# Patient Record
Sex: Female | Born: 1940 | Race: White | Hispanic: No | Marital: Married | Smoking: Never smoker
Health system: Southern US, Community
[De-identification: ages and names within clinical notes are randomized; demographics above are authoritative.]

## PROBLEM LIST (undated history)

## (undated) DIAGNOSIS — E785 Hyperlipidemia, unspecified: Secondary | ICD-10-CM

## (undated) DIAGNOSIS — R270 Ataxia, unspecified: Secondary | ICD-10-CM

## (undated) DIAGNOSIS — G903 Multi-system degeneration of the autonomic nervous system: Secondary | ICD-10-CM

## (undated) HISTORY — PX: SHOULDER SURGERY: SHX246

## (undated) HISTORY — PX: ABDOMINAL HYSTERECTOMY: SHX81

---

## 2009-07-18 ENCOUNTER — Encounter: Admission: RE | Admit: 2009-07-18 | Discharge: 2009-07-18 | Payer: Self-pay | Admitting: Family Medicine

## 2009-07-18 ENCOUNTER — Ambulatory Visit: Payer: Self-pay | Admitting: Family Medicine

## 2009-07-18 DIAGNOSIS — E785 Hyperlipidemia, unspecified: Secondary | ICD-10-CM | POA: Insufficient documentation

## 2009-07-18 DIAGNOSIS — S1093XA Contusion of unspecified part of neck, initial encounter: Secondary | ICD-10-CM

## 2009-07-18 DIAGNOSIS — S0083XA Contusion of other part of head, initial encounter: Secondary | ICD-10-CM

## 2009-07-18 DIAGNOSIS — S0003XA Contusion of scalp, initial encounter: Secondary | ICD-10-CM | POA: Insufficient documentation

## 2009-07-18 DIAGNOSIS — S060X0A Concussion without loss of consciousness, initial encounter: Secondary | ICD-10-CM | POA: Insufficient documentation

## 2009-07-18 DIAGNOSIS — S9030XA Contusion of unspecified foot, initial encounter: Secondary | ICD-10-CM | POA: Insufficient documentation

## 2009-07-19 ENCOUNTER — Encounter: Payer: Self-pay | Admitting: Family Medicine

## 2010-06-11 NOTE — Assessment & Plan Note (Signed)
Summary: R FOOT PAIN/WB   Vital Signs:  Patient Profile:   70 Years Old Female CC:      right foot pain-post fall Height:     66 inches Weight:      200 pounds O2 Sat:      97 % O2 treatment:    Room Air Temp:     97 degrees F oral Pulse rate:   84 / minute Pulse rhythm:   regular Resp:     16 per minute BP sitting:   127 / 77  (right arm)  Pt. in pain?   yes    Location:   right foot    Intensity:   0 at rest    Type:       sharp with movement  Vitals Entered By: Lajean Saver RN (July 18, 2009 2:04 PM)                   Prior Medication List:  No prior medications documented  Updated Prior Medication List: CRESTOR 20 MG TABS (ROSUVASTATIN CALCIUM)   Current Allergies: No known allergies History of Present Illness Chief Complaint: right foot pain-post fall History of Present Illness: [place] the beach and [place]. She is having trouble on her R foot. She hit her head and was stunned> Since then she has had headaches. She has a resting tremor. Her foot was is still hurting. She was concerned that the ortho sho was too big and for the wrong foot that they gave her.   Current Problems: CONTUSION, HEAD (ICD-920) CONCUSSION WITH NO LOSS OF CONSCIOUSNESS (ICD-850.0) CONTUSION OF FOOT (ICD-924.20) HYPERLIPIDEMIA (ICD-272.4)   Current Meds CRESTOR 20 MG TABS (ROSUVASTATIN CALCIUM)   REVIEW OF SYSTEMS Constitutional Symptoms      Denies fever, chills, night sweats, weight loss, weight gain, and fatigue.  Eyes       Denies change in vision, eye pain, eye discharge, glasses, contact lenses, and eye surgery. Ear/Nose/Throat/Mouth       Denies hearing loss/aids, change in hearing, ear pain, ear discharge, dizziness, frequent runny nose, frequent nose bleeds, sinus problems, sore throat, hoarseness, and tooth pain or bleeding.  Respiratory       Denies dry cough, productive cough, wheezing, shortness of breath, asthma, bronchitis, and emphysema/COPD.    Cardiovascular       Denies murmurs, chest pain, and tires easily with exhertion.    Gastrointestinal       Denies stomach pain, nausea/vomiting, diarrhea, constipation, blood in bowel movements, and indigestion. Genitourniary       Denies painful urination, kidney stones, and loss of urinary control. Neurological       Denies paralysis and seizures.      Comments: HA at times, questionable fainting, Musculoskeletal       Complains of muscle pain, joint pain, and joint stiffness.      Denies decreased range of motion, redness, swelling, muscle weakness, and gout.      Comments: right ankle Skin       Denies bruising, unusual mles/lumps or sores, and hair/skin or nail changes.  Psych       Denies mood changes, temper/anger issues, anxiety/stress, speech problems, depression, and sleep problems. Other Comments: Patient fell this past Friday possibly hitting her head. Her occipital region is red. She saw an MD  at the beach who gave her a surgical shoe and said she "chipped" her foot and "stretched her ligaments in her right foot. She has a history of  vertigo and exhibits mild expressive apahsia with tremors which were present befor ethe fall.   Past History:  Family History: Last updated: 07/18/2009 MI-father Dementia- mother CA- sister  Social History: Last updated: 07/18/2009 Married Never Smoked Alcohol use-no Drug use-no  Risk Factors: Smoking Status: never (07/18/2009)  Past Medical History: Hyperlipidemia Undiagnosed tremors and loss of balance  Past Surgical History: GU surgery- 10/2008  Family History: Reviewed history and no changes required. MI-father Dementia- mother CA- sister  Social History: Reviewed history and no changes required. Married Never Smoked Alcohol use-no Drug use-no Smoking Status:  never Drug Use:  no Physical Exam General appearance: well developed, well nourished, no acute distress Head: normocephalic, atraumatic Eyes:  conjunctivae and lids normal Pupils: equal, round, reactive to light Extremities: R fot tender to touch on the lateral surface  Neurological: resting tremor is present Skin: no obvious rashes or lesions MSE: oriented to time, place, and person Assessment New Problems: CONTUSION, HEAD (ICD-920) CONCUSSION WITH NO LOSS OF CONSCIOUSNESS (ICD-850.0) CONTUSION OF FOOT (ICD-924.20) HYPERLIPIDEMIA (ICD-272.4)   Plan New Orders: New Patient Level III [99203] T-CT Head w/o cm [70450] T-DG Foot Complete*R* [73630] T-DG Foot Complete*R* [73630] T-CT Head w/o cm [70450]  The patient and/or caregiver has been counseled thoroughly with regard to medications prescribed including dosage, schedule, interactions, rationale for use, and possible side effects and they verbalize understanding.  Diagnoses and expected course of recovery discussed and will return if not improved as expected or if the condition worsens. Patient and/or caregiver verbalized understanding.

## 2010-06-11 NOTE — Letter (Signed)
Summary: CT AUTHORIZATION  CT AUTHORIZATION   Imported By: Shelbie Proctor 07/19/2009 12:36:46  _____________________________________________________________________  External Attachment:    Type:   Image     Comment:   External Document

## 2012-08-26 ENCOUNTER — Emergency Department
Admission: EM | Admit: 2012-08-26 | Discharge: 2012-08-26 | Disposition: A | Discharge status: Home / Self Care | Payer: Medicare Other | Source: Home / Self Care

## 2012-08-26 ENCOUNTER — Encounter: Payer: Self-pay | Admitting: *Deleted

## 2012-08-26 DIAGNOSIS — L251 Unspecified contact dermatitis due to drugs in contact with skin: Secondary | ICD-10-CM

## 2012-08-26 DIAGNOSIS — L233 Allergic contact dermatitis due to drugs in contact with skin: Secondary | ICD-10-CM

## 2012-08-26 HISTORY — DX: Multi-system degeneration of the autonomic nervous system: G90.3

## 2012-08-26 HISTORY — DX: Ataxia, unspecified: R27.0

## 2012-08-26 HISTORY — DX: Hyperlipidemia, unspecified: E78.5

## 2012-08-26 MED ORDER — TRIAMCINOLONE ACETONIDE 0.1 % EX CREA: TOPICAL_CREAM | Freq: Two times a day (BID) | CUTANEOUS | Status: DC

## 2012-08-26 NOTE — ED Notes (Signed)
Alexx c/o rash to chest and neck x 2 days. She used a new facial wash that she believes she rubbed her neck with and didn't rinse well. Rash itches and burns.

## 2012-08-26 NOTE — ED Provider Notes (Signed)
History     CSN: [id number]  Arrival date & time 08/26/12  1433   None     Chief Complaint  Patient presents with  . Rash       HPI Comments: Patient applied a cream containing 2% salicylic acid to her face two days ago, and some of the cream also contacted her neck and upper chest.  Yesterday she noted a pruritic rash on the exposed areas.  She feels well otherwise.  Patient is a 72 y.o. female presenting with rash. The history is provided by the patient.  Rash Location: upper chest and neck. Quality: burning, dryness, itchiness and redness   Quality: not blistering, not bruising, not draining, not painful, not peeling, not scaling, not swelling and not weeping   Severity:  Mild Onset quality:  Sudden Duration:  1 day Timing:  Constant Progression:  Unchanged Chronicity:  New Context comment:  Medicated cream Relieved by:  Nothing Worsened by:  Nothing tried Associated symptoms: no fatigue, no fever, no headaches, no induration, no joint pain, no myalgias, no nausea, no shortness of breath, no sore throat, no throat swelling, no tongue swelling and no URI     Past Medical History  Diagnosis Date  . Neurologic orthostatic hypotension   . Ataxia   . Hyperlipidemia     Past Surgical History  Procedure Laterality Date  . Shoulder surgery Right     Family History  Problem Relation Age of Onset  . Alzheimer's disease Mother   . Heart attack Father   . Cancer Sister     History  Substance Use Topics  . Smoking status: Never Smoker   . Smokeless tobacco: Never Used  . Alcohol Use: No    OB History   Grav Para Term Preterm Abortions TAB SAB Ect Mult Living                  Review of Systems  Constitutional: Negative for fever and fatigue.  HENT: Negative for sore throat.   Respiratory: Negative for shortness of breath.   Gastrointestinal: Negative for nausea.  Musculoskeletal: Negative for myalgias and arthralgias.  Skin: Positive for rash.    Neurological: Negative for headaches.  All other systems reviewed and are negative.    Allergies  Review of patient's allergies indicates no known allergies.  Home Medications   Current Outpatient Rx  Name  Route  Sig  Dispense  Refill  . fish oil-omega-3 fatty acids 1000 MG capsule   Oral   Take 2 g by mouth daily.         Marland Kitchen triamcinolone cream (KENALOG) 0.1 %   Topical   Apply topically 2 (two) times daily.   30 g   1     BP 139/82  Pulse 67  Temp(Src) 97.7 F (36.5 C) (Oral)  Resp 14  Ht 5\' 6"  (1.676 m)  Wt 204 lb (92.534 kg)  BMI 32.94 kg/m2  SpO2 96%  Physical Exam  Constitutional: She is oriented to person, place, and time. She appears well-developed and well-nourished. No distress.  Patient is obese (BMI 32.9)  HENT:  Head: Atraumatic.  Eyes: Conjunctivae are normal. Pupils are equal, round, and reactive to light.  Neck: Neck supple.  Lymphadenopathy:    She has no cervical adenopathy.  Neurological: She is alert and oriented to person, place, and time.  Skin: Skin is warm and dry. Rash noted. No lesion and no purpura noted. Rash is macular. Rash is not papular, not nodular,  not pustular, not vesicular and not urticarial. There is erythema.     There is mild macular erythema on upper chest and neck as noted on diagram.  No swelling or tenderness.  No vesicles.    ED Course  Procedures        1. Allergic contact dermatitis due to drugs in contact with skin       MDM  Begin triamcinolone 0.1% cream BID. Avoid applying other creams/lotions until rash resolved.  Avoid sun exposure. Minimize baths/showers until rash improved.  Use a mild bath soap containing oil such as unscented Dove. Followup with dermatologist if not improving one week.        Lattie Haw, MD 08/26/12 [phone]

## 2013-03-20 ENCOUNTER — Emergency Department (INDEPENDENT_AMBULATORY_CARE_PROVIDER_SITE_OTHER)
Admission: EM | Admit: 2013-03-20 | Discharge: 2013-03-20 | Disposition: A | Discharge status: Home / Self Care | Payer: Medicare Other | Source: Home / Self Care | Attending: Family Medicine | Admitting: Family Medicine

## 2013-03-20 ENCOUNTER — Encounter: Payer: Self-pay | Admitting: Emergency Medicine

## 2013-03-20 ENCOUNTER — Emergency Department (INDEPENDENT_AMBULATORY_CARE_PROVIDER_SITE_OTHER): Payer: Medicare Other

## 2013-03-20 DIAGNOSIS — S43402A Unspecified sprain of left shoulder joint, initial encounter: Secondary | ICD-10-CM

## 2013-03-20 DIAGNOSIS — IMO0002 Reserved for concepts with insufficient information to code with codable children: Secondary | ICD-10-CM

## 2013-03-20 DIAGNOSIS — M25519 Pain in unspecified shoulder: Secondary | ICD-10-CM

## 2013-03-20 NOTE — ED Notes (Signed)
Patient reports falling in home last night and today left shoulder, arm and upper back are sore upon ROM.

## 2013-03-20 NOTE — ED Provider Notes (Signed)
CSN: [id number]     Arrival date & time 03/20/13  1348 History   First MD Initiated Contact with Patient 03/20/13 1510     Chief Complaint  Patient presents with  . Arm Pain  . Shoulder Pain  . Back Pain      HPI Comments: Patient has a history of orthostatic hypotension for which she takes Florinef.  While arising from a chair last night she became light-headed and fell, injuring her left shoulder.  She denies loss of consciousness however.  Today she has generalized soreness, but her left shoulder is most painful and she has pain when raising her left arm.  Patient is a 72 y.o. female presenting with shoulder injury. The history is provided by the patient.  Shoulder Injury This is a new problem. The current episode started yesterday. The problem occurs constantly. The problem has not changed since onset.Pertinent negatives include no chest pain, no abdominal pain, no headaches and no shortness of breath. Exacerbated by: raising left arm. Nothing relieves the symptoms. She has tried nothing for the symptoms.    Past Medical History  Diagnosis Date  . Neurologic orthostatic hypotension   . Ataxia   . Hyperlipidemia    Past Surgical History  Procedure Laterality Date  . Shoulder surgery Right    Family History  Problem Relation Age of Onset  . Alzheimer's disease Mother   . Heart attack Father   . Cancer Sister    History  Substance Use Topics  . Smoking status: Never Smoker   . Smokeless tobacco: Never Used  . Alcohol Use: No   OB History   Grav Para Term Preterm Abortions TAB SAB Ect Mult Living                 Review of Systems  Respiratory: Negative for shortness of breath.   Cardiovascular: Negative for chest pain.  Gastrointestinal: Negative for abdominal pain.  Neurological: Negative for headaches.  All other systems reviewed and are negative.    Allergies  Review of patient's allergies indicates no known allergies.  Home Medications   Current Outpatient  Rx  Name  Route  Sig  Dispense  Refill  . alendronate (FOSAMAX) 70 MG tablet   Oral   Take 70 mg by mouth every 7 (seven) days. Take with a full glass of water on an empty stomach.         . escitalopram (LEXAPRO) 20 MG tablet   Oral   Take 20 mg by mouth daily.         . fish oil-omega-3 fatty acids 1000 MG capsule   Oral   Take 2 g by mouth daily.         . fludrocortisone (FLORINEF) 0.1 MG tablet   Oral   Take 0.1 mg by mouth daily.         Marland Kitchen omeprazole (PRILOSEC) 20 MG capsule   Oral   Take 20 mg by mouth daily.         Marland Kitchen oxybutynin (DITROPAN-XL) 10 MG 24 hr tablet   Oral   Take 10 mg by mouth daily.         . simvastatin (ZOCOR) 40 MG tablet   Oral   Take 40 mg by mouth every evening.         . triamcinolone cream (KENALOG) 0.1 %   Topical   Apply topically 2 (two) times daily.   30 g   1    BP 142/79  Pulse  73  Temp(Src) 98 F (36.7 C) (Oral)  Resp 16  Ht 5\' 6"  (1.676 m)  Wt 198 lb (89.812 kg)  BMI 31.97 kg/m2  SpO2 96% Physical Exam  Nursing note and vitals reviewed. Constitutional: She is oriented to person, place, and time. She appears well-developed and well-nourished.  Patient is obese (BMI 32.0)  HENT:  Head: Atraumatic.  Mouth/Throat: Oropharynx is clear and moist.  Eyes: Conjunctivae are normal. Pupils are equal, round, and reactive to light.  Neck: Normal range of motion.  Cardiovascular: Normal heart sounds.   Pulmonary/Chest: Breath sounds normal.  Abdominal: There is no tenderness.  Musculoskeletal:       Left shoulder: She exhibits decreased range of motion, tenderness, bony tenderness, pain and decreased strength. She exhibits no swelling, no crepitus, no deformity, no laceration and normal pulse.       Arms: Patient is unable to actively abduct her left shoulder above horizontal, and passively cannot raise above 20 degrees above horizontal.  She has diffuse tenderness over the proximal humerus.  Apley's test positive.   Empty can test positive.  Internal and external rotation strength maintained.  Distal neurovascular function is intact.   Neurological: She is alert and oriented to person, place, and time.  Skin: Skin is warm and dry.    ED Course  Procedures      Imaging Review Dg Shoulder Left  03/20/2013   CLINICAL DATA:  Left shoulder pain post fall  EXAM: LEFT SHOULDER - 2+ VIEW  COMPARISON:  None.  FINDINGS: Three views of left shoulder submitted. No acute fracture or subluxation. Mild degenerative changes left AC joint.  IMPRESSION: No acute fracture or subluxation. Mild degenerative changes left acromioclavicular joint.   Electronically Signed   By: Natasha Mead M.D.   On: 03/20/2013 15:54      MDM   1. Shoulder sprain, left, initial encounter; suspect rotator cuff injury   Patient prefers not to wear sling. Apply ice pack 3 to 4 times daily.  May take Tylenol for pain.  Begin pendulum exercises. Followup with Dr. Rodney Langton in two days. Recommend follow-up with PCP for re-evaluation of her orthostatic hypotension (may need increased dose of Florinef)    Lattie Haw, MD 03/20/13 [phone]

## 2013-03-22 ENCOUNTER — Encounter: Payer: Self-pay | Admitting: Sports Medicine

## 2013-03-22 ENCOUNTER — Ambulatory Visit (INDEPENDENT_AMBULATORY_CARE_PROVIDER_SITE_OTHER): Payer: Medicare Other | Admitting: Sports Medicine

## 2013-03-22 VITALS — BP 119/70 | HR 73 | Wt 201.0 lb

## 2013-03-22 DIAGNOSIS — M25519 Pain in unspecified shoulder: Secondary | ICD-10-CM

## 2013-03-22 DIAGNOSIS — M25512 Pain in left shoulder: Secondary | ICD-10-CM | POA: Insufficient documentation

## 2013-03-22 DIAGNOSIS — I951 Orthostatic hypotension: Secondary | ICD-10-CM

## 2013-03-22 NOTE — Assessment & Plan Note (Signed)
I think that this pleasant 72 year old female has merely strained her rotator cuff. She will do her rotator cuff exercises, continue Aleve, and she can come back to see me if no better in about 3 weeks. We will leave her followup open-ended at this point.

## 2013-03-22 NOTE — Assessment & Plan Note (Signed)
It sounds as though she is on fludrocortisone, she does get approximately 30 mm mercury drop in blood pressure with standing. Unfortunately her current cardiologist has retired and she is looking for a new one. I've asked her to liberalize the salt in her diet, and I am happy to provide a referral to Dr. Jens Som downstairs. I am not her primary care provider, she does see Dr. Gae Dry at [facility].

## 2013-03-22 NOTE — Progress Notes (Signed)
   Subjective:    I'm seeing this patient as a consultation for:  Dr. Cathren Harsh  CC: Left shoulder pain  HPI: This is a pleasant 72 year old female, she is a history of orthostatic hypotension, and fell recently on her left shoulder. She had immediate pain, and a small amount of bruising on her left anterior chest without tenderness. Now she hurts over her deltoid, and is worse with overhead activities. Overall symptoms have improved greatly since her fall. Symptoms are mild, improving. No neck pain, no numbness or tingling radiating down the arm.  Orthostatic hypotension: She does tell me she gets a 30 mmHg drop in her blood pressure on standing, this has led to many falls. She did have a cardiologist who has since retired and she is looking for another one. He has placed her on fludrocortisone per patient report.  Past medical history, Surgical history, Family history not pertinant except as noted below, Social history, Allergies, and medications have been entered into the medical record, reviewed, and no changes needed.   Review of Systems: No headache, visual changes, nausea, vomiting, diarrhea, constipation, dizziness, abdominal pain, skin rash, fevers, chills, night sweats, weight loss, swollen lymph nodes, body aches, joint swelling, muscle aches, chest pain, shortness of breath, mood changes, visual or auditory hallucinations.   Objective:   General: Well Developed, well nourished, and in no acute distress.  Neuro/Psych: Alert and oriented x3, extra-ocular muscles intact, able to move all 4 extremities, sensation grossly intact. Skin: Warm and dry, no rashes noted.  Respiratory: Not using accessory muscles, speaking in full sentences, trachea midline.  Cardiovascular: Pulses palpable, no extremity edema. Abdomen: Does not appear distended. Left Shoulder: Inspection reveals no abnormalities, atrophy or asymmetry. Palpation is normal with no tenderness over AC joint or bicipital  groove. ROM is full in all planes. Rotator cuff strength normal throughout. Positive Neer and Hawkin's tests, empty can sign. Speeds and Yergason's tests normal. No labral pathology noted with negative Obrien's, negative clunk and good stability. Normal scapular function observed. No painful arc and no drop arm sign. No apprehension sign  X-rays were personally reviewed, there is no sign of fracture, dislocation, there is mild to moderate degenerative changes in the acromioclavicular joint. Impression and Recommendations:   This case required medical decision making of moderate complexity.

## 2013-03-29 ENCOUNTER — Encounter: Payer: Self-pay | Admitting: Cardiology

## 2013-03-30 ENCOUNTER — Ambulatory Visit (INDEPENDENT_AMBULATORY_CARE_PROVIDER_SITE_OTHER): Payer: Medicare Other | Admitting: Cardiology

## 2013-03-30 ENCOUNTER — Encounter: Payer: Self-pay | Admitting: Cardiology

## 2013-03-30 VITALS — BP 154/83 | HR 64 | Ht 66.0 in | Wt 201.0 lb

## 2013-03-30 DIAGNOSIS — E785 Hyperlipidemia, unspecified: Secondary | ICD-10-CM

## 2013-03-30 DIAGNOSIS — I951 Orthostatic hypotension: Secondary | ICD-10-CM

## 2013-03-30 MED ORDER — MIDODRINE HCL 5 MG PO TABS: 5.0000 mg | ORAL_TABLET | Freq: Three times a day (TID) | ORAL | Status: DC

## 2013-03-30 NOTE — Patient Instructions (Addendum)
Your physician recommends that you schedule a follow-up appointment in: 4 to 6 WEEKS WITH DR CRENSHAW  STOP FLORINEF  START MIDODRINE 5 MG ONE TABLET THREE TIMES DAILY   USE COMPRESSION HOSE

## 2013-03-30 NOTE — Progress Notes (Signed)
HPI: 72 year old female for evaluation of orthostatic hypotension. Patient has had orthostatic hypotension for 78 years. She was seen by a cardiologist in [place] in 2011 after having a syncopal episode and injuring her shoulder. I do not have those records available. She has been maintained on Florinef. Recently her symptoms have began to increase in frequency. She has not had dyspnea on exertion, orthopnea, PND, pedal edema, chest pain or palpitations. She has occasions where after standing she will have frank syncope. These never occur in a sitting or lying position. She has had 4 episodes in the past one year. 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 She has been seen by neurology in the past.  Current Outpatient Prescriptions  Medication Sig Dispense Refill  . alendronate (FOSAMAX) 70 MG tablet Take 70 mg by mouth every 7 (seven) days. Take with a full glass of water on an empty stomach.      . escitalopram (LEXAPRO) 20 MG tablet Take 20 mg by mouth daily.      . fish oil-omega-3 fatty acids 1000 MG capsule Take 2 g by mouth daily.      Marland Kitchen omeprazole (PRILOSEC) 20 MG capsule Take 20 mg by mouth daily.      Marland Kitchen oxybutynin (DITROPAN-XL) 10 MG 24 hr tablet Take 10 mg by mouth daily.      . simvastatin (ZOCOR) 40 MG tablet Take 40 mg by mouth every evening.      . triamcinolone cream (KENALOG) 0.1 % Apply topically 2 (two) times daily.  30 g  1  . midodrine (PROAMATINE) 5 MG tablet Take 1 tablet (5 mg total) by mouth 3 (three) times daily with meals.  270 tablet  3   No current facility-administered medications for this visit.    No Known Allergies  Past Medical History  Diagnosis Date  . Neurologic orthostatic hypotension   . Ataxia   . Hyperlipidemia     Past Surgical History  Procedure Laterality Date  . Shoulder surgery Right   . Abdominal hysterectomy      History   Social History  . Marital Status: Married    Spouse Name: N/A   Number of Children: 2  . Years of Education: N/A   Occupational History  . Not on file.   Social History Main Topics  . Smoking status: Never Smoker   . Smokeless tobacco: Never Used  . Alcohol Use: No  . Drug Use: No  . Sexual Activity: Not on file   Other Topics Concern  . Not on file   Social History Narrative  . No narrative on file    Family History  Problem Relation Age of Onset  . Alzheimer's disease Mother   . Heart attack Father     MI at age 5  . Cancer Sister     ROS: ataxia and poor balance but no fevers or chills, productive cough, hemoptysis, dysphasia, odynophagia, melena, hematochezia, dysuria, hematuria, rash, seizure activity, orthopnea, PND, pedal edema, claudication. Remaining systems are negative.  Physical Exam:   Blood pressure 154/83, pulse 64, height 5\' 6"  (1.676 m), weight 201 lb (91.173 kg), SpO2 94.00%.  General:  Well developed/well nourished in NAD Skin warm/dry Patient not depressed No peripheral clubbing Back-normal HEENT-normal/normal eyelids Neck supple/normal carotid upstroke bilaterally; no bruits; no JVD; no thyromegaly chest - CTA/ normal expansion CV - RRR/normal S1 and S2; no murmurs, rubs or gallops;  PMI nondisplaced Abdomen -NT/ND, no HSM, no mass, +  bowel sounds, no bruit 2+ femoral pulses, no bruits Ext-no edema, chords, 2+ DP Neuro-intention tremor  ECG sinus rhythm with no ST changes.

## 2013-03-30 NOTE — Assessment & Plan Note (Signed)
Management per primary care. 

## 2013-03-30 NOTE — Assessment & Plan Note (Signed)
Patient is documented to have orthostasis today in the office. Sitting her blood pressure was 150/84 with a pulse of 64. In the standing position she was 122/84 with a pulse of 74. She is having increasing frequency of syncopal episodes after standing. I discussed at length today measures to decrease the frequency of her meds. We discussed the importance of by mouth fluid intake and sodium intake. Have recommended compression hose. I discussed standing slowly. It appears that Florinef is not controlling her symptoms. I will discontinue this medicine and add midodrine 5 mg by mouth 3 times a day. I will see her back in 6-8 weeks to see if her symptoms are better. We'll obtain records from her previous cardiologist including LV function on previous echo.

## 2013-04-13 ENCOUNTER — Encounter: Payer: Self-pay | Admitting: Cardiology

## 2013-04-13 ENCOUNTER — Ambulatory Visit (INDEPENDENT_AMBULATORY_CARE_PROVIDER_SITE_OTHER): Payer: Medicare Other | Admitting: Cardiology

## 2013-04-13 VITALS — BP 140/80 | HR 68 | Wt 203.0 lb

## 2013-04-13 DIAGNOSIS — I951 Orthostatic hypotension: Secondary | ICD-10-CM

## 2013-04-13 DIAGNOSIS — E785 Hyperlipidemia, unspecified: Secondary | ICD-10-CM

## 2013-04-13 NOTE — Patient Instructions (Signed)
Your physician recommends that you schedule a follow-up appointment in: 3 MONTHS WITH DR CRENSHAW  

## 2013-04-13 NOTE — Progress Notes (Signed)
      HPI: FU orthostatic hypotension. Patient has had orthostatic hypotension for 7-8 years. Previously followed by a cardiologist in [place]. When I saw her in November we discontinued her Florinef and began midodrine because of worsening symptoms. Since then, she denies dyspnea, chest pain. She has had no recurrent syncopal episodes. She denies dizziness with standing.  Current Outpatient Prescriptions  Medication Sig Dispense Refill  . alendronate (FOSAMAX) 70 MG tablet Take 70 mg by mouth every 7 (seven) days. Take with a full glass of water on an empty stomach.      . escitalopram (LEXAPRO) 20 MG tablet Take 20 mg by mouth daily.      . fish oil-omega-3 fatty acids 1000 MG capsule Take 2 g by mouth daily.      . midodrine (PROAMATINE) 5 MG tablet Take 1 tablet (5 mg total) by mouth 3 (three) times daily with meals.  270 tablet  3  . omeprazole (PRILOSEC) 20 MG capsule Take 20 mg by mouth daily.      Marland Kitchen oxybutynin (DITROPAN-XL) 10 MG 24 hr tablet Take 10 mg by mouth daily.      . simvastatin (ZOCOR) 40 MG tablet Take 40 mg by mouth every evening.       No current facility-administered medications for this visit.     Past Medical History  Diagnosis Date  . Neurologic orthostatic hypotension   . Ataxia   . Hyperlipidemia     Past Surgical History  Procedure Laterality Date  . Shoulder surgery Right   . Abdominal hysterectomy      History   Social History  . Marital Status: Married    Spouse Name: N/A    Number of Children: 2  . Years of Education: N/A   Occupational History  . Not on file.   Social History Main Topics  . Smoking status: Never Smoker   . Smokeless tobacco: Never Used  . Alcohol Use: No  . Drug Use: No  . Sexual Activity: Not on file   Other Topics Concern  . Not on file   Social History Narrative  . No narrative on file    ROS: no fevers or chills, productive cough, hemoptysis, dysphasia, odynophagia, melena, hematochezia, dysuria,  hematuria, rash, seizure activity, orthopnea, PND, pedal edema, claudication. Remaining systems are negative.  Physical Exam: Well-developed well-nourished in no acute distress.  Skin is warm and dry.  HEENT is normal.  Neck is supple.  Chest is clear to auscultation with normal expansion.  Cardiovascular exam is regular rate and rhythm.  Abdominal exam nontender or distended. No masses palpated. Extremities show no edema. neuro resting tremors and unsteady gait

## 2013-04-13 NOTE — Assessment & Plan Note (Signed)
Patient feels that present measures have improved her overall symptoms. There is no dizziness with standing and she has had no syncope. Continue midodrine at present dose. Continue compression hose. Continue high sodium diet and increase by mouth fluid intake. We can increase the dose of her midodrine in the future if her symptoms worsen.

## 2013-04-13 NOTE — Assessment & Plan Note (Signed)
Management per primary care. 

## 2013-04-19 ENCOUNTER — Telehealth: Payer: Self-pay | Admitting: Cardiology

## 2013-04-19 NOTE — Telephone Encounter (Signed)
ROI faxed to [facility] @ [phone]

## 2013-04-27 ENCOUNTER — Ambulatory Visit: Payer: Medicare Other | Admitting: Cardiology

## 2013-05-11 ENCOUNTER — Telehealth: Payer: Self-pay | Admitting: Cardiology

## 2013-05-11 NOTE — Telephone Encounter (Signed)
Records rec From [facility] will Hold For Debra M

## 2013-05-17 ENCOUNTER — Encounter: Payer: Self-pay | Admitting: Sports Medicine

## 2013-05-17 ENCOUNTER — Ambulatory Visit (INDEPENDENT_AMBULATORY_CARE_PROVIDER_SITE_OTHER): Payer: Medicare Other | Admitting: Sports Medicine

## 2013-05-17 VITALS — BP 125/69 | HR 70 | Wt 207.0 lb

## 2013-05-17 DIAGNOSIS — M25519 Pain in unspecified shoulder: Secondary | ICD-10-CM

## 2013-05-17 DIAGNOSIS — M25512 Pain in left shoulder: Secondary | ICD-10-CM

## 2013-05-17 NOTE — Assessment & Plan Note (Signed)
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 Home rehabilitation exercises, return to see me in one month.

## 2013-05-17 NOTE — Progress Notes (Signed)
  Subjective:    CC:  Followup  HPI: Left shoulder pain: I saw this pleasant 73 year old female about 2 months ago after a fall onto her left shoulder, unfortunately she's continued to have pain she localizes directly over the a.c. joint as well as over the deltoid, worse with crossing her arm across the chest and overhead activities, no mechanical symptoms. No pain in the neck, no radiation down to the hand her fingertips. Moderate, persistent.  Past medical history, Surgical history, Family history not pertinant except as noted below, Social history, Allergies, and medications have been entered into the medical record, reviewed, and no changes needed.   Review of Systems: No fevers, chills, night sweats, weight loss, chest pain, or shortness of breath.   Objective:    General: Well Developed, well nourished, and in no acute distress.  Neuro: Alert and oriented x3, extra-ocular muscles intact, sensation grossly intact.  HEENT: Normocephalic, atraumatic, pupils equal round reactive to light, neck supple, no masses, no lymphadenopathy, thyroid nonpalpable.  Skin: Warm and dry, no rashes. Cardiac: Regular rate and rhythm, no murmurs rubs or gallops, no lower extremity edema.  Respiratory: Clear to auscultation bilaterally. Not using accessory muscles, speaking in full sentences. Left shoulder: Positive Neer's, Hawkins, and in she can sign, positive cross arm sign, negative crank test, negative O'Brien's test. Tender to palpation directly over the acromioclavicular joint.  Procedure: Real-time Ultrasound Guided Injection of left acromioclavicular joint Device: GE Logiq E  Verbal informed consent obtained.  Time-out conducted.  Noted no overlying erythema, induration, or other signs of local infection.  Skin prepped in a sterile fashion.  Local anesthesia: Topical Ethyl chloride.  With sterile technique and under real time ultrasound guidance:  25-gauge needle advanced into the joint, 0.5 cc  Kenalog 40, 1 cc lidocaine injected easily. Completed without difficulty  Pain immediately resolved suggesting accurate placement of the medication.  Advised to call if fevers/chills, erythema, induration, drainage, or persistent bleeding.  Images permanently stored and available for review in the ultrasound unit.  Impression: Technically successful ultrasound guided injection.  Procedure: Real-time Ultrasound Guided Injection of left subacromial bursa Device: GE Logiq E  Verbal informed consent obtained.  Time-out conducted.  Noted no overlying erythema, induration, or other signs of local infection.  Skin prepped in a sterile fashion.  Local anesthesia: Topical Ethyl chloride.  With sterile technique and under real time ultrasound guidance:  I did note a small bursal sided tear at the distal insertion of the supraspinatus, the results of a small partial thickness articular sided tear of the supraspinatus as well. I placed 1 cc Kenalog 40, 3 cc lidocaine directly into the subacromial bursa. Completed without difficulty  Pain immediately resolved suggesting accurate placement of the medication.  Advised to call if fevers/chills, erythema, induration, drainage, or persistent bleeding.  Images permanently stored and available for review in the ultrasound unit.  Impression: Technically successful ultrasound guided injection.  Impression and Recommendations:

## 2013-06-21 ENCOUNTER — Encounter: Payer: Self-pay | Admitting: Sports Medicine

## 2013-06-21 ENCOUNTER — Ambulatory Visit (INDEPENDENT_AMBULATORY_CARE_PROVIDER_SITE_OTHER): Payer: Medicare Other | Admitting: Sports Medicine

## 2013-06-21 VITALS — BP 122/69 | HR 87 | Ht 66.0 in | Wt 208.0 lb

## 2013-06-21 DIAGNOSIS — M25512 Pain in left shoulder: Secondary | ICD-10-CM

## 2013-06-21 DIAGNOSIS — M25519 Pain in unspecified shoulder: Secondary | ICD-10-CM

## 2013-06-21 NOTE — Assessment & Plan Note (Signed)
Unfortunately failed conservative measures in the form of physical therapy, NSAIDs, last visit injections were placed into the acromioclavicular joint and subacromial bursa, she returns pain-free. Return as needed.

## 2013-06-21 NOTE — Progress Notes (Signed)
  Subjective:    CC: Followup  HPI: Left shoulder pain: At the last visit I performed ultrasound guided acromioclavicular and subacromial bursa injections. She returns today completely pain-free.  Past medical history, Surgical history, Family history not pertinant except as noted below, Social history, Allergies, and medications have been entered into the medical record, reviewed, and no changes needed.   Review of Systems: No fevers, chills, night sweats, weight loss, chest pain, or shortness of breath.   Objective:    General: Well Developed, well nourished, and in no acute distress.  Neuro: Alert and oriented x3, extra-ocular muscles intact, sensation grossly intact.  HEENT: Normocephalic, atraumatic, pupils equal round reactive to light, neck supple, no masses, no lymphadenopathy, thyroid nonpalpable.  Skin: Warm and dry, no rashes. Cardiac: Regular rate and rhythm, no murmurs rubs or gallops, no lower extremity edema.  Respiratory: Clear to auscultation bilaterally. Not using accessory muscles, speaking in full sentences. Left Shoulder: Inspection reveals no abnormalities, atrophy or asymmetry. Palpation is normal with no tenderness over AC joint or bicipital groove. ROM is full in all planes. Rotator cuff strength normal throughout. No signs of impingement with negative Neer and Hawkin's tests, empty can sign. Speeds and Yergason's tests normal. No labral pathology noted with negative Obrien's, negative clunk and good stability. Normal scapular function observed. No painful arc and no drop arm sign. No apprehension sign  Impression and Recommendations:

## 2013-07-20 ENCOUNTER — Encounter: Payer: Self-pay | Admitting: Cardiology

## 2013-07-20 ENCOUNTER — Other Ambulatory Visit: Payer: Self-pay | Admitting: *Deleted

## 2013-07-20 ENCOUNTER — Ambulatory Visit (INDEPENDENT_AMBULATORY_CARE_PROVIDER_SITE_OTHER): Payer: Medicare Other | Admitting: Cardiology

## 2013-07-20 VITALS — BP 130/68 | HR 67 | Ht 66.0 in | Wt 208.0 lb

## 2013-07-20 DIAGNOSIS — I951 Orthostatic hypotension: Secondary | ICD-10-CM

## 2013-07-20 DIAGNOSIS — E785 Hyperlipidemia, unspecified: Secondary | ICD-10-CM

## 2013-07-20 MED ORDER — MIDODRINE HCL 5 MG PO TABS: 5.0000 mg | ORAL_TABLET | Freq: Three times a day (TID) | ORAL | Status: DC

## 2013-07-20 NOTE — Patient Instructions (Signed)
Your physician wants you to follow-up in: 6 MONTHS WITH DR CRENSHAW You will receive a reminder letter in the mail two months in advance. If you don't receive a letter, please call our office to schedule the follow-up appointment.  

## 2013-07-20 NOTE — Assessment & Plan Note (Signed)
Patient feels that present measures have improved her overall symptoms. There is no dizziness with standing and she has had no syncope although she has fallen. Continue midodrine at present dose. Continue compression hose. Continue high sodium diet and increase by mouth fluid intake. We can increase the dose of her midodrine in the future if her symptoms worsen.

## 2013-07-20 NOTE — Progress Notes (Signed)
      HPI: FU orthostatic hypotension. Patient has had orthostatic hypotension for 7-8 years. [place]. When I saw her in November 2014 we discontinued her Florinef and began midodrine because of worsening symptoms. I last saw her in Dec 2014. Since then, she denies dyspnea, chest pain. She has fallen but had no recurrent syncopal episodes. She denies dizziness with standing.   Current Outpatient Prescriptions  Medication Sig Dispense Refill  . alendronate (FOSAMAX) 70 MG tablet Take 70 mg by mouth every 7 (seven) days. Take with a full glass of water on an empty stomach.      . escitalopram (LEXAPRO) 20 MG tablet Take 20 mg by mouth daily.      . fish oil-omega-3 fatty acids 1000 MG capsule Take 2 g by mouth daily.      . midodrine (PROAMATINE) 5 MG tablet Take 1 tablet (5 mg total) by mouth 3 (three) times daily with meals.  270 tablet  3  . omeprazole (PRILOSEC) 20 MG capsule Take 20 mg by mouth daily.      Marland Kitchen. oxybutynin (DITROPAN-XL) 10 MG 24 hr tablet Take 10 mg by mouth daily.      . simvastatin (ZOCOR) 40 MG tablet Take 40 mg by mouth every evening.       No current facility-administered medications for this visit.     Past Medical History  Diagnosis Date  . Neurologic orthostatic hypotension   . Ataxia   . Hyperlipidemia     Past Surgical History  Procedure Laterality Date  . Shoulder surgery Right   . Abdominal hysterectomy      History   Social History  . Marital Status: Married    Spouse Name: N/A    Number of Children: 2  . Years of Education: N/A   Occupational History  . Not on file.   Social History Main Topics  . Smoking status: Never Smoker   . Smokeless tobacco: Never Used  . Alcohol Use: No  . Drug Use: No  . Sexual Activity: Not on file   Other Topics Concern  . Not on file   Social History Narrative  . No narrative on file    ROS: no fevers or chills, productive cough, hemoptysis, dysphasia,  odynophagia, melena, hematochezia, dysuria, hematuria, rash, seizure activity, orthopnea, PND, pedal edema, claudication. Remaining systems are negative.  Physical Exam: Well-developed well-nourished in no acute distress.  Skin is warm and dry.  HEENT is normal.  Neck is supple.  Chest is clear to auscultation with normal expansion.  Cardiovascular exam is regular rate and rhythm.  Abdominal exam nontender or distended. No masses palpated. Extremities show no edema. neuro ataxia and tremor  ECG sinus rhythm at a rate of 67. No ST changes.

## 2013-07-20 NOTE — Assessment & Plan Note (Signed)
Continue statin. 

## 2014-03-29 ENCOUNTER — Encounter: Payer: Self-pay | Admitting: Cardiology

## 2014-03-29 ENCOUNTER — Ambulatory Visit (INDEPENDENT_AMBULATORY_CARE_PROVIDER_SITE_OTHER): Payer: Medicare Other | Admitting: Cardiology

## 2014-03-29 VITALS — BP 130/78 | HR 69 | Ht 66.0 in | Wt 207.1 lb

## 2014-03-29 DIAGNOSIS — I951 Orthostatic hypotension: Secondary | ICD-10-CM

## 2014-03-29 NOTE — Progress Notes (Signed)
      HPI: FU orthostatic hypotension. Patient has had orthostatic hypotension for years. Previously followed by a cardiologist in [place]. When I saw her in November 2014 we discontinued her Florinef and began midodrine because of worsening symptoms; she improved; since last seen, She has no dyspnea, chest pain or palpitations. She has some dizziness with standing but much improved compared to the past.  Current Outpatient Prescriptions  Medication Sig Dispense Refill  . alendronate (FOSAMAX) 70 MG tablet Take 70 mg by mouth every 7 (seven) days. Take with a full glass of water on an empty stomach.    . escitalopram (LEXAPRO) 20 MG tablet Take 20 mg by mouth daily.    . fish oil-omega-3 fatty acids 1000 MG capsule Take 2 g by mouth daily.    . midodrine (PROAMATINE) 5 MG tablet Take 1 tablet (5 mg total) by mouth 3 (three) times daily with meals. 270 tablet 3  . omeprazole (PRILOSEC) 20 MG capsule Take 20 mg by mouth daily.    . simvastatin (ZOCOR) 40 MG tablet Take 40 mg by mouth every evening.    Marland Kitchen. oxybutynin (DITROPAN-XL) 10 MG 24 hr tablet Take 10 mg by mouth daily.     No current facility-administered medications for this visit.     Past Medical History  Diagnosis Date  . Neurologic orthostatic hypotension   . Ataxia   . Hyperlipidemia     Past Surgical History  Procedure Laterality Date  . Shoulder surgery Right   . Abdominal hysterectomy      History   Social History  . Marital Status: Married    Spouse Name: N/A    Number of Children: 2  . Years of Education: N/A   Occupational History  . Not on file.   Social History Main Topics  . Smoking status: Never Smoker   . Smokeless tobacco: Never Used  . Alcohol Use: No  . Drug Use: No  . Sexual Activity: Not on file   Other Topics Concern  . Not on file   Social History Narrative    ROS: no fevers or chills, productive cough, hemoptysis, dysphasia, odynophagia, melena, hematochezia, dysuria, hematuria,  rash, seizure activity, orthopnea, PND, pedal edema, claudication. Remaining systems are negative.  Physical Exam: Well-developed well-nourished in no acute distress.  Skin is warm and dry.  HEENT is normal.  Neck is supple.  Chest is clear to auscultation with normal expansion.  Cardiovascular exam is regular rate and rhythm.  Abdominal exam nontender or distended. No masses palpated. Extremities show no edema. neuro ataxia  ECG Sinus rhythm at a rate of 69. Normal axis. No significant ST changes.

## 2014-03-29 NOTE — Patient Instructions (Signed)
Your physician wants you to follow-up in: ONE YEAR WITH DR CRENSHAW You will receive a reminder letter in the mail two months in advance. If you don't receive a letter, please call our office to schedule the follow-up appointment.  

## 2014-03-29 NOTE — Assessment & Plan Note (Signed)
Patient feels that midodrine is controlling her overall symptoms. There is mild dizziness with standing. Continue present dose. Continue compression hose. Continue high sodium diet and increase by mouth fluid intake. We can increase the dose of her midodrine in the future if her symptoms worsen.

## 2014-03-29 NOTE — Assessment & Plan Note (Signed)
Continue statin. 

## 2014-09-11 ENCOUNTER — Other Ambulatory Visit: Payer: Self-pay

## 2014-09-11 DIAGNOSIS — I951 Orthostatic hypotension: Secondary | ICD-10-CM

## 2014-09-11 MED ORDER — MIDODRINE HCL 5 MG PO TABS: 5.0000 mg | ORAL_TABLET | Freq: Three times a day (TID) | ORAL | Status: DC

## 2015-03-14 ENCOUNTER — Other Ambulatory Visit: Payer: Self-pay | Admitting: Cardiology

## 2015-03-14 DIAGNOSIS — I951 Orthostatic hypotension: Secondary | ICD-10-CM

## 2015-03-14 MED ORDER — MIDODRINE HCL 5 MG PO TABS: 5.0000 mg | ORAL_TABLET | Freq: Three times a day (TID) | ORAL | Status: DC

## 2015-03-27 IMAGING — CR DG SHOULDER 2+V*L*
3 series · 3 of 3 positions shown · non-contrast
Comparison: None.

CLINICAL DATA: Left shoulder pain post fall

EXAM:
LEFT SHOULDER - 2+ VIEW

[view not recorded (1 of 3)]
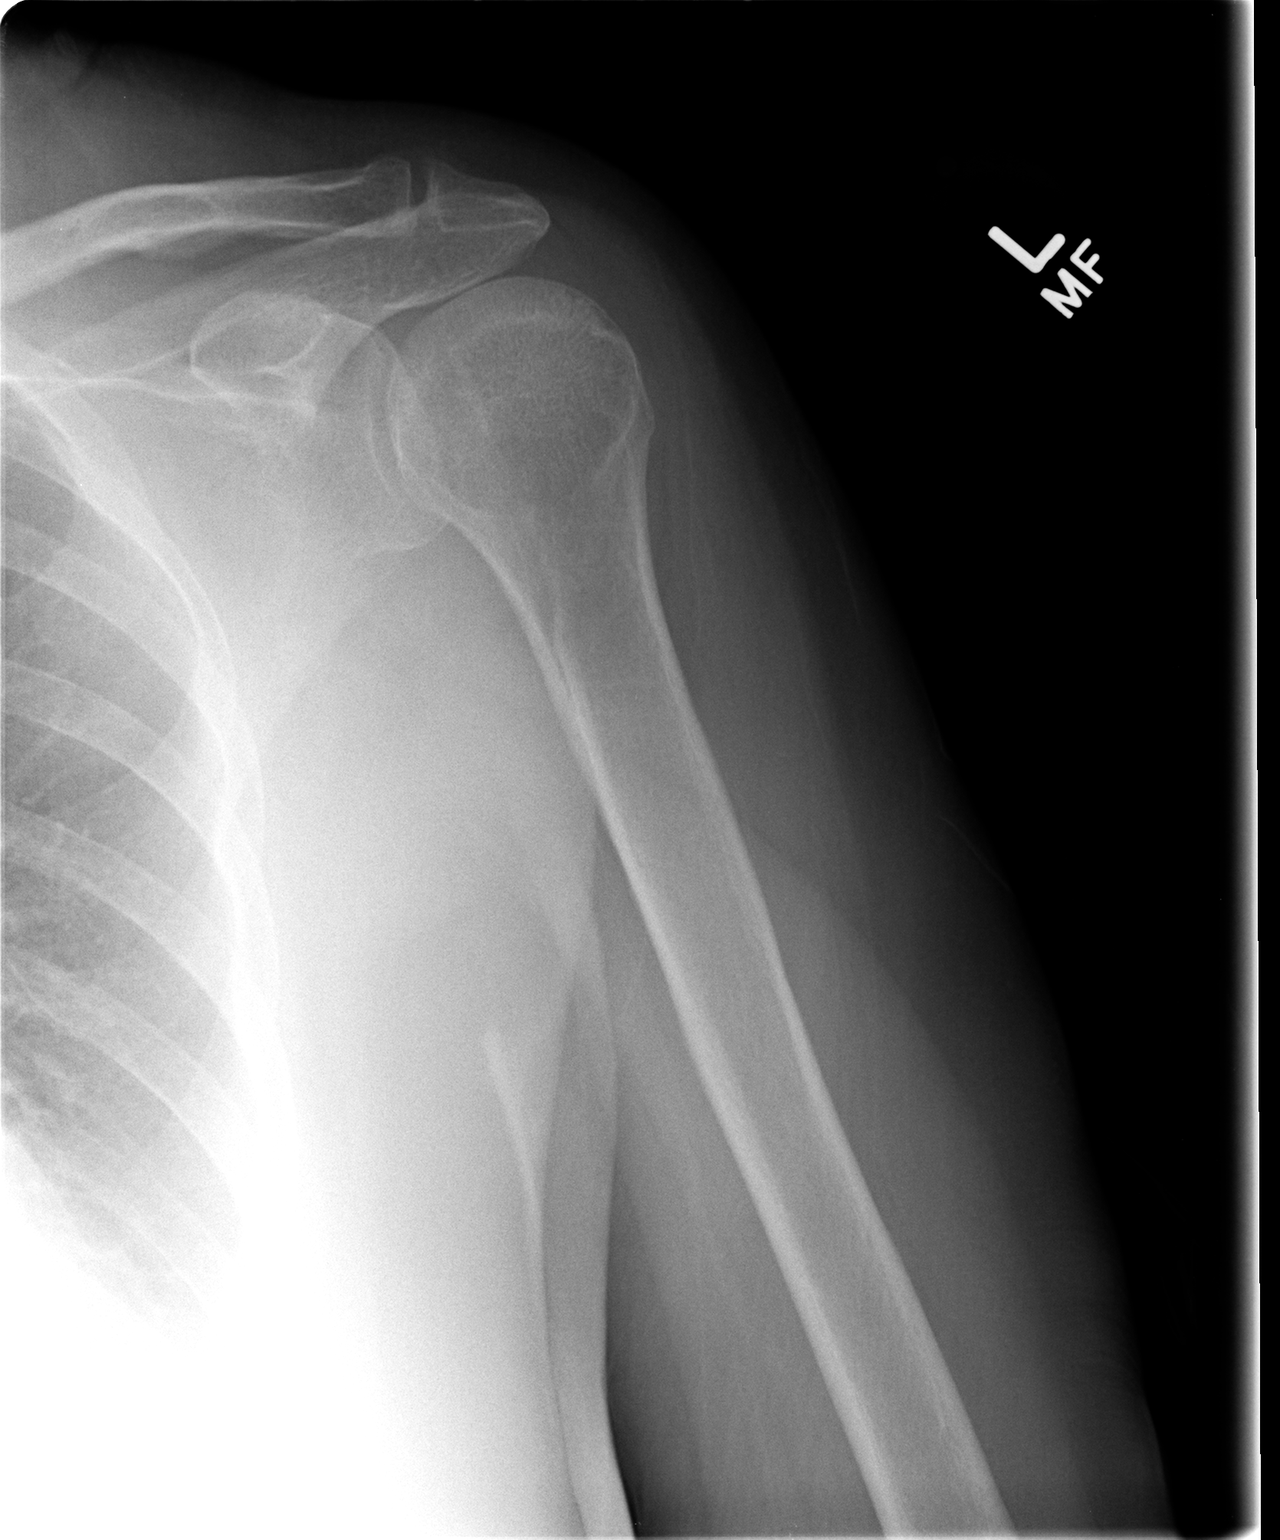

[view not recorded (2 of 3)]
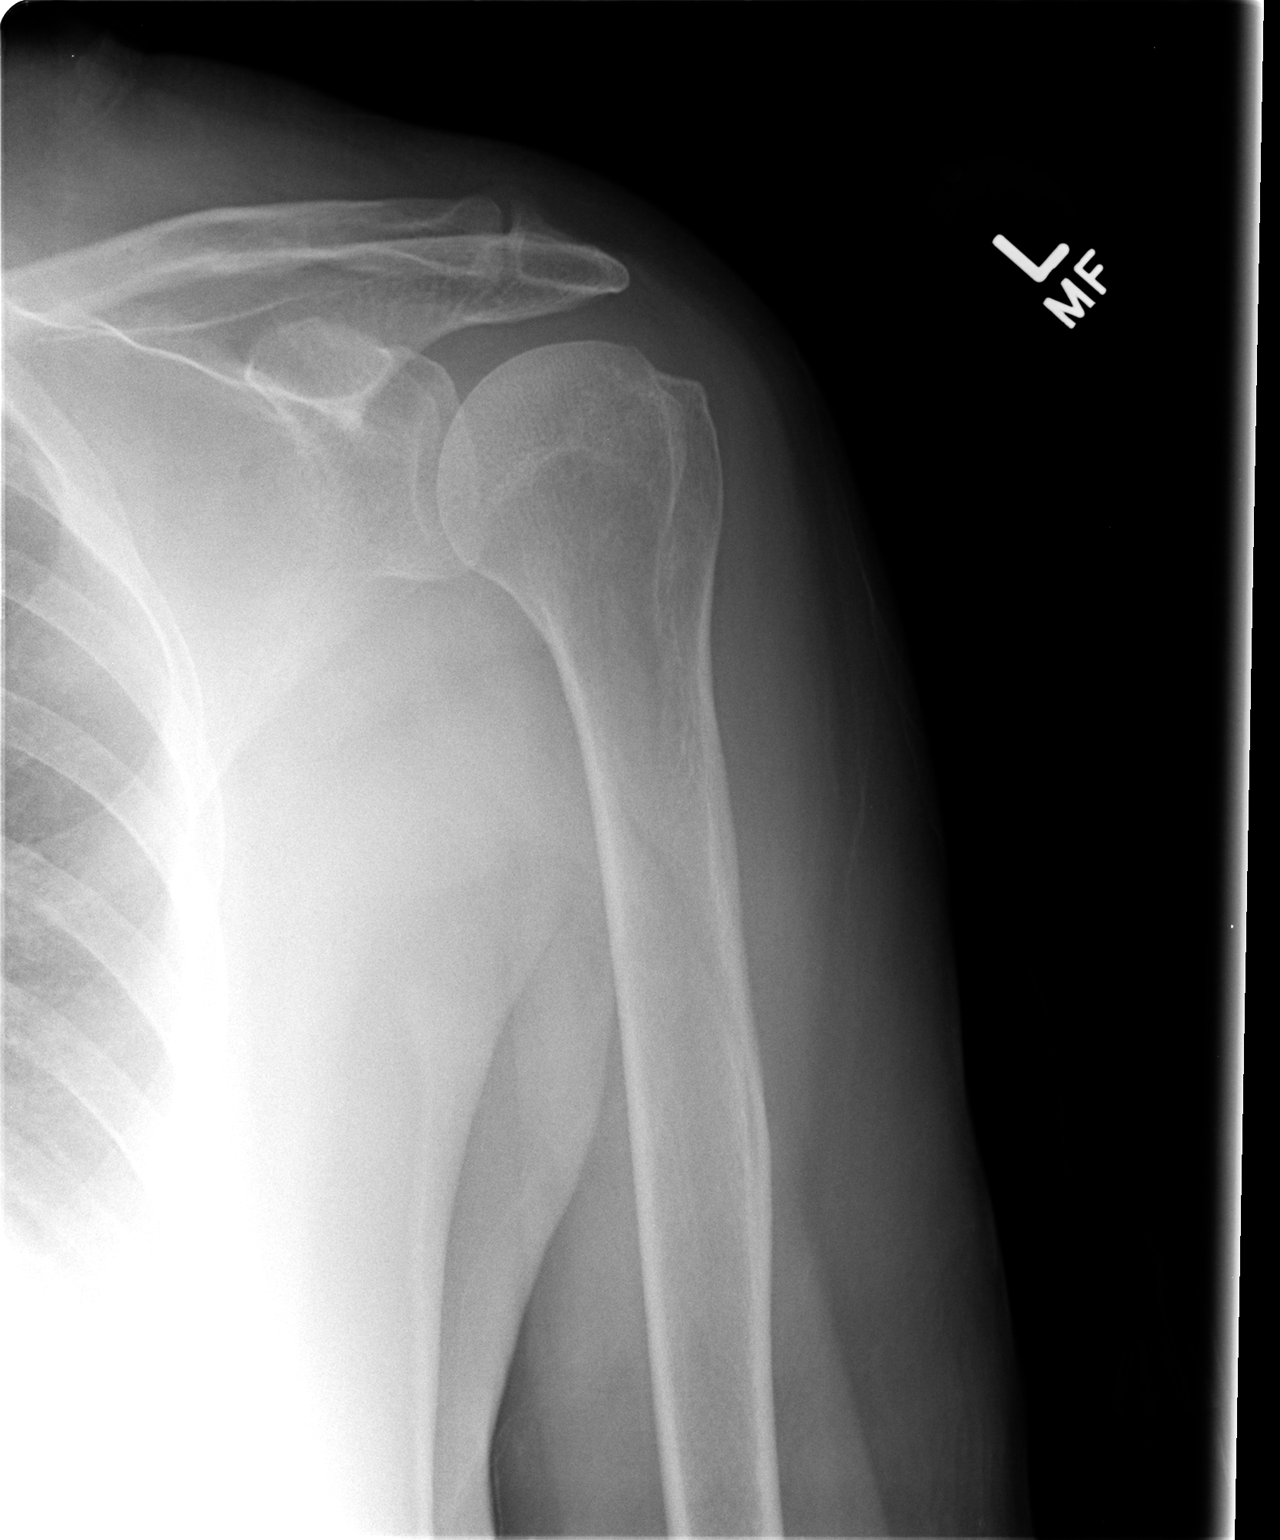

[view not recorded (3 of 3)]
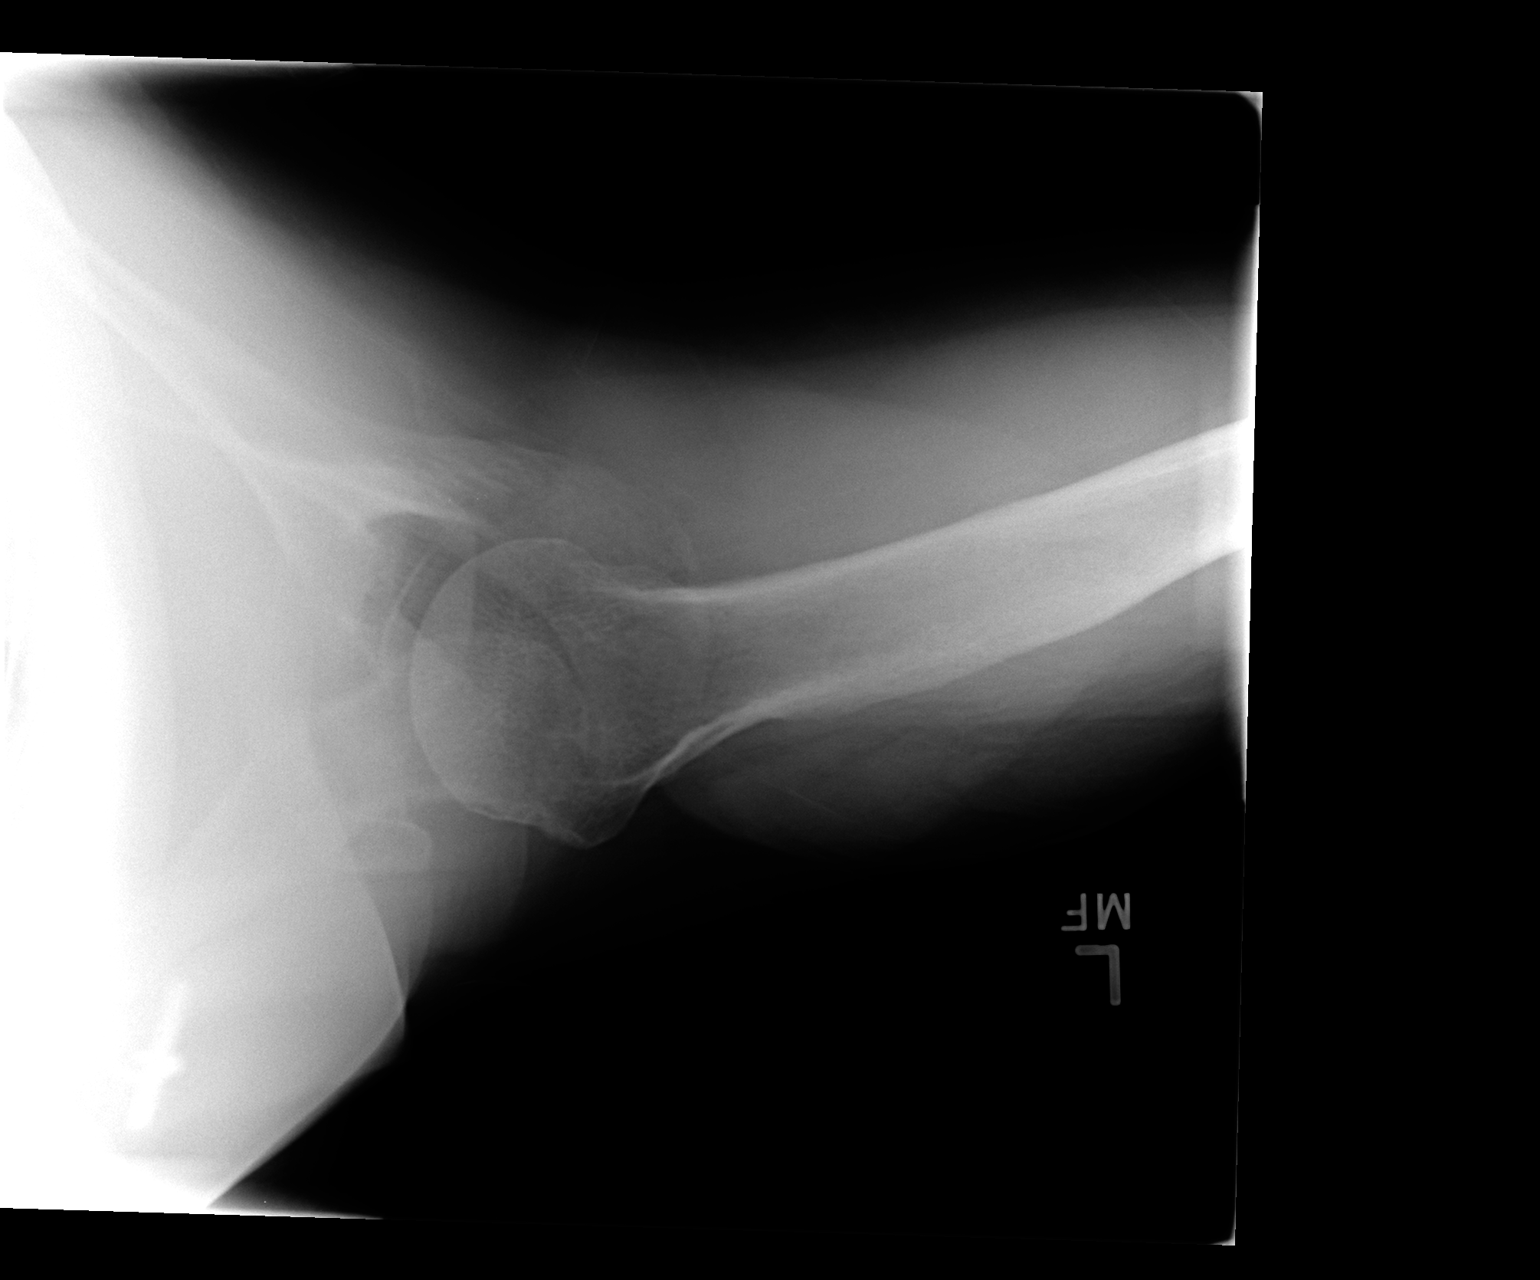

[3 of 3 positions shown; findings below may reference images not displayed]

FINDINGS: Three views of left shoulder submitted. No acute fracture or
subluxation. Mild degenerative changes left AC joint.
IMPRESSION: No acute fracture or subluxation. Mild degenerative changes left
acromioclavicular joint.

## 2015-06-08 ENCOUNTER — Other Ambulatory Visit: Payer: Self-pay | Admitting: *Deleted

## 2015-06-08 DIAGNOSIS — I951 Orthostatic hypotension: Secondary | ICD-10-CM

## 2015-06-08 MED ORDER — MIDODRINE HCL 5 MG PO TABS: 5.0000 mg | ORAL_TABLET | Freq: Three times a day (TID) | ORAL | Status: DC

## 2015-06-21 NOTE — Progress Notes (Signed)
      HPI: FU orthostatic hypotension. Patient has had orthostatic hypotension for years. Previously followed by a cardiologist in [place]. Echo 4/11 showed normal LV function and grade 1 diastolic dysfunction. When I saw her in November 2014 we discontinued her Florinef and began midodrine because of worsening symptoms; she improved. Since last seen, There is no dyspnea, chest pain, palpitations. She has minimal dizziness with standing and has not had syncope.  Current Outpatient Prescriptions  Medication Sig Dispense Refill  . escitalopram (LEXAPRO) 20 MG tablet Take 20 mg by mouth daily.    . fish oil-omega-3 fatty acids 1000 MG capsule Take 2 g by mouth daily.    . midodrine (PROAMATINE) 5 MG tablet Take 1 tablet (5 mg total) by mouth 3 (three) times daily with meals. NEEDS APPOINTMENT FOR REFILLS 135 tablet 0  . omeprazole (PRILOSEC) 20 MG capsule Take 20 mg by mouth daily.    Marland Kitchen oxybutynin (DITROPAN-XL) 10 MG 24 hr tablet Take 10 mg by mouth daily.     No current facility-administered medications for this visit.     Past Medical History  Diagnosis Date  . Neurologic orthostatic hypotension (HCC)   . Ataxia   . Hyperlipidemia     Past Surgical History  Procedure Laterality Date  . Shoulder surgery Right   . Abdominal hysterectomy      Social History   Social History  . Marital Status: Married    Spouse Name: N/A  . Number of Children: 2  . Years of Education: N/A   Occupational History  . Not on file.   Social History Main Topics  . Smoking status: Never Smoker   . Smokeless tobacco: Never Used  . Alcohol Use: No  . Drug Use: No  . Sexual Activity: Not on file   Other Topics Concern  . Not on file   Social History Narrative    Family History  Problem Relation Age of Onset  . Alzheimer's disease Mother   . Heart attack Father     MI at age 57  . Cancer Sister     ROS: Unsteady gait and tremors but no fevers or chills, productive cough,  hemoptysis, dysphasia, odynophagia, melena, hematochezia, dysuria, hematuria, rash, seizure activity, orthopnea, PND, pedal edema, claudication. Remaining systems are negative.  Physical Exam: Well-developed well-nourished in no acute distress.  Skin is warm and dry.  HEENT is normal.  Neck is supple.  Chest is clear to auscultation with normal expansion.  Cardiovascular exam is regular rate and rhythm.  Abdominal exam nontender or distended. No masses palpated. Extremities show no edema. Neuro Tremors consistent with neurological disease  ECG Sinus rhythm at a rate of 70. Normal axis. No ST changes.

## 2015-06-27 ENCOUNTER — Ambulatory Visit (INDEPENDENT_AMBULATORY_CARE_PROVIDER_SITE_OTHER): Payer: Medicare Other | Admitting: Cardiology

## 2015-06-27 ENCOUNTER — Encounter: Payer: Self-pay | Admitting: Cardiology

## 2015-06-27 VITALS — BP 130/82 | HR 70 | Ht 66.0 in | Wt 213.4 lb

## 2015-06-27 DIAGNOSIS — I951 Orthostatic hypotension: Secondary | ICD-10-CM

## 2015-06-27 NOTE — Assessment & Plan Note (Signed)
Management per primary care. 

## 2015-06-27 NOTE — Patient Instructions (Signed)
Your physician wants you to follow-up in: ONE YEAR WITH DR CRENSHAW You will receive a reminder letter in the mail two months in advance. If you don't receive a letter, please call our office to schedule the follow-up appointment.  

## 2015-06-27 NOTE — Assessment & Plan Note (Signed)
Patient feels that midodrine is controlling her overall symptoms. There is mild dizziness with standing. Continue present dose. Continue compression hose. Continue high sodium diet and increase by mouth fluid intake. We can increase the dose of her midodrine in the future if her symptoms worsen. 

## 2015-07-19 ENCOUNTER — Other Ambulatory Visit: Payer: Self-pay | Admitting: Cardiology

## 2015-07-19 NOTE — Telephone Encounter (Signed)
Rx(s) sent to pharmacy electronically.  

## 2015-11-12 ENCOUNTER — Other Ambulatory Visit: Payer: Self-pay

## 2015-11-12 MED ORDER — MIDODRINE HCL 5 MG PO TABS: 5.0000 mg | ORAL_TABLET | Freq: Three times a day (TID) | ORAL | Status: DC

## 2016-04-13 ENCOUNTER — Emergency Department
Admission: EM | Admit: 2016-04-13 | Discharge: 2016-04-13 | Disposition: A | Discharge status: Home / Self Care | Payer: Medicare Other | Source: Home / Self Care | Attending: Family Medicine | Admitting: Family Medicine

## 2016-04-13 DIAGNOSIS — J069 Acute upper respiratory infection, unspecified: Secondary | ICD-10-CM | POA: Diagnosis not present

## 2016-04-13 DIAGNOSIS — B9789 Other viral agents as the cause of diseases classified elsewhere: Secondary | ICD-10-CM | POA: Diagnosis not present

## 2016-04-13 DIAGNOSIS — J040 Acute laryngitis: Secondary | ICD-10-CM

## 2016-04-13 MED ORDER — AZITHROMYCIN 250 MG PO TABS: 250.0000 mg | ORAL_TABLET | Freq: Every day | ORAL | 0 refills | Status: DC

## 2016-04-13 MED ORDER — BENZONATATE 100 MG PO CAPS: 100.0000 mg | ORAL_CAPSULE | Freq: Three times a day (TID) | ORAL | 0 refills | Status: DC

## 2016-04-13 MED ORDER — GUAIFENESIN ER 600 MG PO TB12: 600.0000 mg | ORAL_TABLET | Freq: Two times a day (BID) | ORAL | 0 refills | Status: DC

## 2016-04-13 NOTE — ED Provider Notes (Signed)
CSN: [id number]     Arrival date & time 04/13/16  1331 History   First MD Initiated Contact with Patient 04/13/16 1405     Chief Complaint  Patient presents with  . Sore Throat  . Cough   (Consider location/radiation/quality/duration/timing/severity/associated sxs/prior Treatment) HPI  Sarah Winters is a 75 y.o. female presenting to UC with c/o sudden onset sore throat and hoarse voice that started yesterday, associated moderately productive cough with greenish phlegm this morning. She has not tried anything for her symptoms. She notes [facility] and prescribed an antibiotic. 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 Denies fever, chills, n/v/d. Denies chest pain or SOB. No hx of asthma.    Past Medical History:  Diagnosis Date  . Ataxia   . Hyperlipidemia   . Neurologic orthostatic hypotension (HCC)    Past Surgical History:  Procedure Laterality Date  . ABDOMINAL HYSTERECTOMY    . SHOULDER SURGERY Right    Family History  Problem Relation Age of Onset  . Alzheimer's disease Mother   . Heart attack Father     MI at age 77  . Cancer Sister    Social History  Substance Use Topics  . Smoking status: Never Smoker  . Smokeless tobacco: Never Used  . Alcohol use No   OB History    No data available     Review of Systems  Constitutional: Negative for chills and fever.  HENT: Positive for congestion, sore throat and voice change. Negative for ear pain and trouble swallowing.   Respiratory: Positive for cough. Negative for shortness of breath.   Cardiovascular: Negative for chest pain and palpitations.  Gastrointestinal: Negative for abdominal pain, diarrhea, nausea and vomiting.  Musculoskeletal: Negative for arthralgias, back pain and myalgias.  Skin: Negative for rash.    Allergies  Patient has no known allergies.  Home Medications   Prior to Admission medications   Medication Sig Start Date End Date Taking? Authorizing Provider   azithromycin (ZITHROMAX) 250 MG tablet Take 1 tablet (250 mg total) by mouth daily. Take first 2 tablets together, then 1 every day until finished. 04/13/16   Junius FinnerErin O'Malley, PA-C  benzonatate (TESSALON) 100 MG capsule Take 1-2 capsules (100-200 mg total) by mouth every 8 (eight) hours. 04/13/16   Junius FinnerErin O'Malley, PA-C  escitalopram (LEXAPRO) 20 MG tablet Take 20 mg by mouth daily.    Historical Provider, MD  fish oil-omega-3 fatty acids 1000 MG capsule Take 2 g by mouth daily.    Historical Provider, MD  guaiFENesin (MUCINEX) 600 MG 12 hr tablet Take 1 tablet (600 mg total) by mouth 2 (two) times daily. Take with large glass of water 04/13/16   Junius FinnerErin O'Malley, PA-C  midodrine (PROAMATINE) 5 MG tablet Take 1 tablet (5 mg total) by mouth 3 (three) times daily with meals. 11/12/15   Lewayne BuntingBrian S Crenshaw, MD  omeprazole (PRILOSEC) 20 MG capsule Take 20 mg by mouth daily.    Historical Provider, MD  oxybutynin (DITROPAN-XL) 10 MG 24 hr tablet Take 10 mg by mouth daily.    Historical Provider, MD   Meds Ordered and Administered this Visit  Medications - No data to display  BP 141/84 (BP Location: Left Arm)   Pulse 98   Temp 97.6 F (36.4 C) (Oral)   Ht 5\' 6"  (1.676 m)   Wt 209 lb 12.8 oz (95.2 kg)   SpO2 95%   BMI 33.86 kg/m  No data found.  Physical Exam  Constitutional: She appears well-developed and well-nourished. No distress.  HENT:  Head: Normocephalic and atraumatic.  Right Ear: Tympanic membrane normal.  Left Ear: Tympanic membrane normal.  Nose: Nose normal.  Mouth/Throat: Uvula is midline, oropharynx is clear and moist and mucous membranes are normal.  Eyes: Conjunctivae are normal. No scleral icterus.  Neck: Normal range of motion. Neck supple.  Hoarse voice w/o stridor.  Cardiovascular: Normal rate, regular rhythm and normal heart sounds.   Pulmonary/Chest: Effort normal and breath sounds normal. No stridor. No respiratory distress. She has no wheezes. She has no rales.  Abdominal:  Soft. She exhibits no distension. There is no tenderness.  Musculoskeletal: Normal range of motion.  Lymphadenopathy:    She has no cervical adenopathy.  Neurological: She is alert.  Skin: Skin is warm and dry. She is not diaphoretic.  Nursing note and vitals reviewed.   Urgent Care Course   Clinical Course     Procedures (including critical care time)  Labs Review Labs Reviewed - No data to display  Imaging Review No results found.   MDM   1. Viral URI with cough   2. Laryngitis    Pt c/o sudden onset sore throat with hoarse voice and productive cough that started this morning. No evidence of bacterial infection at this time.  Reassured pt symptoms are likely viral. Encouraged symptomatic treatment. Rx: Mucinex and tessalon Prescription to hold for azithromycin. Encouraged to only fill if not improving in about 1 week or she develops a persistent fever. F/u with PCP in 7-10 days if not improving. Patient verbalized understanding and agreement with treatment plan.    Junius Finnerrin O'Malley, PA-C 04/13/16 [phone]

## 2016-04-13 NOTE — ED Triage Notes (Signed)
Sore throat, and hoarseness started yesterday.  Coughing up greenish phlegm

## 2016-04-13 NOTE — Discharge Instructions (Signed)
°  Your symptoms are likely due to a virus such as the common cold, however, if you developing worsening chest congestion with shortness of breath, persistent fever for 3 days, or symptoms not improving in 4-5 days, you may fill the antibiotic (azithromycin).  If you do fill the antibiotic,  please take antibiotics as prescribed and be sure to complete entire course even if you start to feel better to ensure infection does not come back. ° °

## 2016-06-02 ENCOUNTER — Other Ambulatory Visit: Payer: Self-pay

## 2016-06-02 MED ORDER — MIDODRINE HCL 5 MG PO TABS: 5.0000 mg | ORAL_TABLET | Freq: Three times a day (TID) | ORAL | 1 refills | Status: DC

## 2016-11-04 ENCOUNTER — Other Ambulatory Visit: Payer: Self-pay | Admitting: Cardiology

## 2016-11-04 NOTE — Telephone Encounter (Signed)
REFILL 

## 2017-01-30 ENCOUNTER — Other Ambulatory Visit: Payer: Self-pay | Admitting: Cardiology

## 2017-03-23 ENCOUNTER — Other Ambulatory Visit: Payer: Self-pay | Admitting: Cardiology

## 2017-03-23 NOTE — Telephone Encounter (Signed)
REFILL 

## 2017-04-03 ENCOUNTER — Encounter: Payer: Self-pay | Admitting: Emergency Medicine

## 2017-04-03 ENCOUNTER — Other Ambulatory Visit: Payer: Self-pay

## 2017-04-03 ENCOUNTER — Emergency Department (INDEPENDENT_AMBULATORY_CARE_PROVIDER_SITE_OTHER)
Admission: EM | Admit: 2017-04-03 | Discharge: 2017-04-03 | Disposition: A | Discharge status: Home / Self Care | Payer: Medicare Other | Source: Home / Self Care | Attending: Family Medicine | Admitting: Family Medicine

## 2017-04-03 DIAGNOSIS — H1132 Conjunctival hemorrhage, left eye: Secondary | ICD-10-CM

## 2017-04-03 NOTE — ED Triage Notes (Signed)
Left eye red since yesterday, denies pain, no drainage, no injury, no vision changes, does not wear glasses or contacts

## 2017-04-03 NOTE — ED Provider Notes (Signed)
[facility]    CSN: [id number] Arrival date & time: 04/03/17  1406     History   Chief Complaint Chief Complaint  Patient presents with  . Eye Problem    HPI Sarah Winters is a [age] y.o. female.   HPI Sarah Winters is a 76 y.o. female presenting to UC with concern for sudden onset Left eye redness/blood in the corner of her eye that she noticed yesterday morning while washing her face after waking up. Denies pain, itching or drainage from her eye. Denies change in vision. No known trauma. No recent cough, sneeze, or vomiting.    Past Medical History:  Diagnosis Date  . Ataxia   . Hyperlipidemia   . Neurologic orthostatic hypotension [facility](HCC)     Patient Active Problem List   Diagnosis Date Noted  . Left shoulder pain 03/22/2013  . Orthostatic hypotension 03/22/2013  . HYPERLIPIDEMIA 07/18/2009  . CONCUSSION WITH NO LOSS OF CONSCIOUSNESS 07/18/2009  . CONTUSION, HEAD 07/18/2009  . CONTUSION OF FOOT 07/18/2009    Past Surgical History:  Procedure Laterality Date  . ABDOMINAL HYSTERECTOMY    . SHOULDER SURGERY Right     OB History    No data available       Home Medications    Prior to Admission medications   Medication Sig Start Date End Date Taking? Authorizing Provider  escitalopram (LEXAPRO) 20 MG tablet Take 20 mg by mouth daily.    [provider]  fish oil-omega-3 fatty acids 1000 MG capsule Take 2 g by mouth daily.    [provider]  midodrine (PROAMATINE) 5 MG tablet TAKE 1 TABLET 3 TIMES A DAY WITH FOOD 03/23/17   Lewayne Buntingrenshaw, Brian S, MD  omeprazole (PRILOSEC) 20 MG capsule Take 20 mg by mouth daily.    [provider]  oxybutynin (DITROPAN-XL) 10 MG 24 hr tablet Take 10 mg by mouth daily.    [provider]    Family History Family History  Problem Relation Age of Onset  . Alzheimer's disease Mother   . Heart attack Father        MI at age 78  . Cancer Sister     Social History Social  History   Tobacco Use  . Smoking status: Never Smoker  . Smokeless tobacco: Never Used  Substance Use Topics  . Alcohol use: No  . Drug use: No     Allergies   Patient has no known allergies.   Review of Systems Review of Systems  Eyes: Positive for redness. Negative for photophobia, pain, itching and visual disturbance.  Neurological: Negative for dizziness and light-headedness.     Physical Exam Triage Vital Signs ED Triage Vitals  Enc Vitals Group     BP 04/03/17 1454 97/65     Pulse Rate 04/03/17 1454 76     Resp --      Temp 04/03/17 1454 98.4 F (36.9 C)     Temp Source 04/03/17 1454 Oral     SpO2 04/03/17 1454 96 %     Weight 04/03/17 1456 210 lb (95.3 kg)     Height 04/03/17 1456 5\' 5"  (1.651 m)     Head Circumference --      Peak Flow --      Pain Score 04/03/17 1456 0     Pain Loc --      Pain Edu? --      Excl. in GC? --    No data found.  Updated Vital  Signs BP 97/65 (BP Location: Right Arm)   Pulse 76   Temp 98.4 F (36.9 C) (Oral)   Ht 5\' 5"  (1.651 m)   Wt 210 lb (95.3 kg)   SpO2 96%   BMI 34.95 kg/m   Visual Acuity Right Eye Distance:   Left Eye Distance:   Bilateral Distance:    Right Eye Near:   Left Eye Near:    Bilateral Near:     Physical Exam  Constitutional: She is oriented to person, place, and time. She appears well-developed and well-nourished. No distress.  HENT:  Head: Normocephalic.  Nose: Nose normal.  Eyes: EOM are normal. Pupils are equal, round, and reactive to light. Right eye exhibits no discharge. Left eye exhibits no discharge. Left conjunctiva has a hemorrhage. No scleral icterus.    Left eye: subconjunctival hemorrhage in medial aspect of eye. No surrounding periorbital erythema, edema, or tenderness.   Cardiovascular: Normal rate.  Pulmonary/Chest: Effort normal. No respiratory distress.  Neurological: She is alert and oriented to person, place, and time. No cranial nerve deficit.  Skin: Skin is warm  and dry. She is not diaphoretic.  Nursing note and vitals reviewed.    UC Treatments / Results  Labs (all labs ordered are listed, but only abnormal results are displayed) Labs Reviewed - No data to display  EKG  EKG Interpretation None       Radiology No results found.  Procedures Procedures (including critical care time)  Medications Ordered in UC Medications - No data to display   Initial Impression / Assessment and Plan / UC Course  I have reviewed the triage vital signs and the nursing notes.  Pertinent labs & imaging results that were available during my care of the patient were reviewed by me and considered in my medical decision making (see chart for details).     Hx and exam c/w subconjunctival hemorrhage. No evidence of underlying infection Reassured pt  Pt is due for her routine eye exam. Encouraged to f/u with eye specialist later this week for routine exam, however, no specific treatment indicated at this time. Encouraged to avoid rubbing eye.  Pt info packet provided.   Final Clinical Impressions(s) / UC Diagnoses   Final diagnoses:  Non-traumatic subconjunctival hemorrhage of left eye    ED Discharge Orders    None       Controlled Substance Prescriptions [place] Controlled Substance Registry consulted? Not Applicable   Rolla Platehelps, Jniyah Dantuono O, PA-C 04/03/17 1556

## 2017-04-29 ENCOUNTER — Telehealth: Payer: Self-pay | Admitting: Cardiology

## 2017-04-29 MED ORDER — MIDODRINE HCL 5 MG PO TABS: ORAL_TABLET | ORAL | 0 refills | Status: DC

## 2017-04-29 NOTE — Telephone Encounter (Signed)
Refill sent to the pharmacy electronically.  

## 2017-04-29 NOTE — Telephone Encounter (Signed)
°*  STAT* If patient is at the pharmacy, call can be transferred to refill team.   1. Which medications need to be refilled? (please list name of each medication and dose if known) amidodrine 5mg  2. Which pharmacy/location (including street and city if local pharmacy) is medication to be sent to? [facility] on [address] 3. Do they need a 30 day or 90 day supply? 30days

## 2017-07-01 NOTE — Progress Notes (Signed)
      HPI: FU orthostatic hypotension. Patient has had orthostatic hypotension for years. Previously [place] in [place]. Echo 4/11 showed normal LV function and grade 1 diastolic dysfunction. When I saw her in November 2014 we discontinued her Florinef and began midodrine because of worsening symptoms; she improved. Since last seen 2/17, she denies chest pain, palpitations or pedal edema.  Occasional mild dizziness with standing but no syncope.  Her neurological disease is progressing and she has significant tremor and instability by her report.  Current Outpatient Medications  Medication Sig Dispense Refill  . escitalopram (LEXAPRO) 20 MG tablet Take 20 mg by mouth daily.    . fish oil-omega-3 fatty acids 1000 MG capsule Take 2 g by mouth daily.    . midodrine (PROAMATINE) 5 MG tablet TAKE 1 TABLET 3 TIMES A DAY WITH FOOD 270 tablet 0  . omeprazole (PRILOSEC) 20 MG capsule Take 20 mg by mouth daily.    Marland Kitchen. oxybutynin (DITROPAN-XL) 10 MG 24 hr tablet Take 10 mg by mouth daily.     No current facility-administered medications for this visit.      Past Medical History:  Diagnosis Date  . Ataxia   . Hyperlipidemia   . Neurologic orthostatic hypotension (HCC)     Past Surgical History:  Procedure Laterality Date  . ABDOMINAL HYSTERECTOMY    . SHOULDER SURGERY Right     Social History   Socioeconomic History  . Marital status: Married    Spouse name: Not on file  . Number of children: 2  . Years of education: Not on file  . Highest education level: Not on file  Social Needs  . Financial resource strain: Not on file  . Food insecurity - worry: Not on file  . Food insecurity - inability: Not on file  . Transportation needs - medical: Not on file  . Transportation needs - non-medical: Not on file  Occupational History  . Not on file  Tobacco Use  . Smoking status: Never Smoker  . Smokeless tobacco: Never Used  Substance and Sexual Activity  . Alcohol  use: No  . Drug use: No  . Sexual activity: Not on file  Other Topics Concern  . Not on file  Social History Narrative  . Not on file    Family History  Problem Relation Age of Onset  . Alzheimer's disease Mother   . Heart attack Father        MI at age 79  . Cancer Sister     ROS: no fevers or chills, productive cough, hemoptysis, dysphasia, odynophagia, melena, hematochezia, dysuria, hematuria, rash, seizure activity, orthopnea, PND, pedal edema, claudication. Remaining systems are negative.  Physical Exam: Well-developed well-nourished in no acute distress.  Skin is warm and dry.  HEENT is normal.  Neck is supple.  Chest is clear to auscultation with normal expansion.  Cardiovascular exam is regular rate and rhythm.  Abdominal exam nontender or distended. No masses palpated. Extremities show no edema. neuro baseline tremor.  ECG- NSR, RAD, low voltage; personally reviewed  A/P  1 orthostatic hypotension-patient has mild symptoms. Midodrine appears to be controlling symptoms; will continue.  We discussed the importance of compression hose, increased sodium intake and increased fluid intake.  She has not had syncope.  2 Hyperlipidemia-management per primary care.  She has some dyspnea.  3 Neurologic disorder -she has been [facility] for [facility] management.  Olga MillersBrian Chantz Montefusco, MD

## 2017-07-08 ENCOUNTER — Ambulatory Visit (INDEPENDENT_AMBULATORY_CARE_PROVIDER_SITE_OTHER): Payer: Medicare Other | Admitting: Cardiology

## 2017-07-08 ENCOUNTER — Encounter: Payer: Self-pay | Admitting: Cardiology

## 2017-07-08 VITALS — BP 145/77 | HR 79 | Ht 65.0 in | Wt 222.0 lb

## 2017-07-08 DIAGNOSIS — E7800 Pure hypercholesterolemia, unspecified: Secondary | ICD-10-CM | POA: Diagnosis not present

## 2017-07-08 DIAGNOSIS — I951 Orthostatic hypotension: Secondary | ICD-10-CM | POA: Diagnosis not present

## 2017-07-08 MED ORDER — MIDODRINE HCL 5 MG PO TABS: ORAL_TABLET | ORAL | 3 refills | Status: DC

## 2017-07-08 NOTE — Patient Instructions (Signed)
Your physician wants you to follow-up in: ONE YEAR WITH DR CRENSHAW You will receive a reminder letter in the mail two months in advance. If you don't receive a letter, please call our office to schedule the follow-up appointment.   If you need a refill on your cardiac medications before your next appointment, please call your pharmacy.  

## 2018-02-17 ENCOUNTER — Telehealth: Payer: Self-pay | Admitting: Cardiology

## 2018-02-17 DIAGNOSIS — I951 Orthostatic hypotension: Secondary | ICD-10-CM

## 2018-02-17 NOTE — Telephone Encounter (Signed)
Returned call to patient, she states the other day (10/3) she went to get her hair cut and she was walking out to the car and the next thing she knows she woke up on the curb.  She was unable to get up by herself, someone that walked by had to help her up.  She is unsure how long she was passed out.  No loss of bowel or bladder control.  Did not hit her head.   Did not have any symptoms prior, no dizziness, lightheadedness, palpitations, CP, SOB.   She states she got in her car, drove home and once inside the house she passed out again, unsure duration.   She hit her head on the door but again denies any symptoms prior or after.   [place] she [place] not [place] anyone.   She does not take BP or HR at home.   She thinks her medication may need to be increased.    Offered APP appt tomorrow at [facility] but she is unable to come, she is unsure if anyone can bring her.    She usually sees Dr. Jens Som in [place] but it not at that office until 10/23-no availability.       Hx: orthostatic hypotension, verified she is still taking midodrine 5 mg TID  Advised to increase fluid intake and liberalize salt intake, use compression stockings and would send message to Dr. Jens Som.   Advised to avoid driving and to change positions slowly.   Advised if she has another syncopal episode to proceed to ER for evaluation.    She states she may be able to get a family member to bring her to the [facility] office but she is unsure and would like to send message to Dr. Jens Som prior.

## 2018-02-17 NOTE — Telephone Encounter (Signed)
New message   Pt c/o Syncope: STAT if syncope occurred within 30 minutes and pt complains of lightheadedness High Priority if episode of passing out, completely, today or in last 24 hours   1. Did you pass out today? 02/11/2018: patient passed out twice on this day.  2. When is the last time you passed out?The patient has not passed out anymore since 02/11/2018.  3. Has this occurred multiple times? Yes  4. Did you have any symptoms prior to passing out? No

## 2018-02-18 MED ORDER — MIDODRINE HCL 10 MG PO TABS: ORAL_TABLET | ORAL | Status: DC

## 2018-02-18 NOTE — Telephone Encounter (Signed)
Patient advised to increase midodrine from 5 mg to 10 mg three times a day. Informed patient Dr. Jens Som recommends no driving at this time. Patient agreeable and verbalized understanding. No further questions. Patient has been scheduled with Joni Reining, NP on Tuesday, 03/02/18 at 1:30 at the [facility] office.

## 2018-02-18 NOTE — Telephone Encounter (Signed)
Change midodrine to 10 mg TID; paov; no driving Olga Millers

## 2018-02-26 ENCOUNTER — Telehealth: Payer: Self-pay | Admitting: Adult Health

## 2018-02-26 NOTE — Telephone Encounter (Signed)
Spoke with pt dtr, Aware of dr crenshaw's recommendations.  

## 2018-02-26 NOTE — Telephone Encounter (Signed)
Ok to decrease midodrine; fu as scheduled Sarah Winters

## 2018-02-26 NOTE — Telephone Encounter (Signed)
Returned call to patient's daughter Judeth Cornfield.Patient gave me permission to talk to daughter.She stated ever since mother increased Midodrine to 10 mg three times a day she has had increased swelling in lower legs and feet.Stated she today she decreased Midodrine back to 5 mg three times a day.Advised to wear compression socks and elevate feet as much as possible.I will send message to [facility] for advice.

## 2018-02-26 NOTE — Telephone Encounter (Signed)
New Message         Sarah Winters is calling she assist the patient from time to time. Patient was taking prescribe medication for her heart (not sure of the name)  medication is now swelling her ankles and feet, face is turning bright red (When taking medication). Sarah Winters is asking what should she do to help patient? Pls call and advise. [phone]

## 2018-03-01 NOTE — Progress Notes (Signed)
Cardiology Office Note   Date:  03/02/2018   ID:  Sarah Winters, DOB 1941-03-21, MRN [id number]  PCP:  Jamal Collin, PA-C  Cardiologist: [facility]  Chief Complaint  Patient presents with  . Hypotension     History of Present Illness: Sarah Winters is a 77 y.o. female who presents for ongoing assessment and management of neurologic orthostatic hypotension. She was placed on midodrine with improvement of symptoms. On last office visit 07/08/2017 she had some progression of her neurologic symptoms but did not complain of dizziness. Other history includes hyperlipidemia,and mild dyspnea.   Daughter called our office on 02/26/2018 with patients complaints of increased LEE since midodrine was increased to 10 mg TID. She decreased the midodrine down to 5 mg TID. She was advised to wear compression hose and to elevate her feet as often as possible.   She is feeling much better today with reduced dose of midodrine to 5 mg three times a day. She reports that the higher 10 mg dose made her feel "foggy" and flushed. She no longer has these symptoms. She is wearing the support hose and this has helped the LEE.    Past Medical History:  Diagnosis Date  . Ataxia   . Hyperlipidemia   . Neurologic orthostatic hypotension (HCC)     Past Surgical History:  Procedure Laterality Date  . ABDOMINAL HYSTERECTOMY    . SHOULDER SURGERY Right      Current Outpatient Medications  Medication Sig Dispense Refill  . escitalopram (LEXAPRO) 20 MG tablet Take 20 mg by mouth daily.    . midodrine (PROAMATINE) 5 MG tablet Take 1 tablet (5 mg total) by mouth 3 (three) times daily with meals. 90 tablet 1  . oxybutynin (DITROPAN-XL) 10 MG 24 hr tablet Take 10 mg by mouth daily.     No current facility-administered medications for this visit.     Allergies:   Shellfish-derived products and Shellfish allergy    Social History:  The patient  reports that she has never smoked. She has never used  smokeless tobacco. She reports that she does not drink alcohol or use drugs.   Family History:  The patient's family history includes Alzheimer's disease in her mother; Cancer in her sister; Heart attack in her father.    ROS: All other systems are reviewed and negative. Unless otherwise mentioned in H&P    PHYSICAL EXAM: VS:  BP 137/84   Pulse 79   Ht 5\' 5"  (1.651 m)   Wt 226 lb 12.8 oz (102.9 kg)   BMI 37.74 kg/m  , BMI Body mass index is 37.74 kg/m. GEN: Well nourished, well developed, in no acute distress HEENT: normal Neck: no JVD, carotid bruits, or masses Cardiac: RRR; no murmurs, rubs, or gallops,no edema  Respiratory:  Clear to auscultation bilaterally, normal work of breathing GI: soft, nontender, nondistended, + BS MS: no deformity or atrophy Skin: warm and dry, no rash Neuro:  Strength and sensation are intact Psych: euthymic mood, full affect   EKG:  NSR rate of 78 bpm.   Recent Labs: No results found for requested labs within last 8760 hours.    Lipid Panel No results found for: CHOL, TRIG, HDL, CHOLHDL, VLDL, LDLCALC, LDLDIRECT    Wt Readings from Last 3 Encounters:  03/02/18 226 lb 12.8 oz (102.9 kg)  07/08/17 222 lb (100.7 kg)  04/03/17 210 lb (95.3 kg)     ASSESSMENT AND PLAN:  1.  Orthostatic Hypotension: She is now on lower dose of  Midodrine to 5 mg TID which she is tolerating much better. She is wearing support hose. Orthostatics were completed no evidence of orthostasis. Continue current dose of 5 mg TID. Rx provided.     2. Hyperlipidemia: Continue low cholesterol diet.    Current medicines are reviewed at length with the patient today.  Wants to be followed in [place].   Labs/ tests ordered today include: None Bettey Mare. Liborio Nixon, ANP, AACC   03/02/2018 2:22 PM    [facility] [address] Office [phone] Fax [phone]

## 2018-03-02 ENCOUNTER — Ambulatory Visit: Payer: Medicare Other | Admitting: Adult Health

## 2018-03-02 ENCOUNTER — Encounter: Payer: Self-pay | Admitting: Adult Health

## 2018-03-02 VITALS — BP 137/84 | HR 79 | Ht 65.0 in | Wt 226.8 lb

## 2018-03-02 DIAGNOSIS — E7800 Pure hypercholesterolemia, unspecified: Secondary | ICD-10-CM | POA: Diagnosis not present

## 2018-03-02 DIAGNOSIS — I950 Idiopathic hypotension: Secondary | ICD-10-CM

## 2018-03-02 MED ORDER — MIDODRINE HCL 5 MG PO TABS: 5.0000 mg | ORAL_TABLET | Freq: Three times a day (TID) | ORAL | 1 refills | Status: DC

## 2018-03-02 NOTE — Patient Instructions (Signed)
Follow-Up: You will need a follow up appointment in FEB 2020-AS PLANNED IN [place].  You may see  DR Jens Som IN [place], -or- one of the following Advanced Practice Providers on your designated Care Team:     - Corine Shelter, [place]  Medication Instructions:  NO CHANGES- Your physician recommends that you continue on your current medications as directed. Please refer to the Current Medication list given to you today.  If you need a refill on your cardiac medications before your next appointment, please call your pharmacy.  Labwork: If you have labs (blood work) drawn today and your tests are completely normal, you will receive your results ONLY by: . MyChart Message (if you have MyChart) -OR- . A paper copy in the mail  At [facility], you and your health needs are our priority.  As part of our continuing mission to provide you with exceptional heart care, we have created designated Provider Care Teams.  These Care Teams include your primary Cardiologist (physician) and Advanced Practice Providers (APPs -  Physician Assistants and Nurse Practitioners) who all work together to provide you with the care you need, when you need it.  Thank you for choosing [facility] at [facility]!!

## 2018-05-26 ENCOUNTER — Other Ambulatory Visit: Payer: Self-pay | Admitting: Adult Health

## 2018-05-26 NOTE — Telephone Encounter (Signed)
Rx request sent to pharmacy.  

## 2018-06-23 ENCOUNTER — Other Ambulatory Visit: Payer: Self-pay

## 2018-06-23 MED ORDER — MIDODRINE HCL 5 MG PO TABS: 5.0000 mg | ORAL_TABLET | Freq: Three times a day (TID) | ORAL | 7 refills | Status: DC

## 2018-06-23 NOTE — Progress Notes (Signed)
HPI: FU orthostatic hypotension. Patient has had orthostatic hypotension for years. Previously followed by a cardiologist in [place]. Echo 4/11 showed normal LV function and grade 1 diastolic dysfunction. When I saw her in November 2014 we discontinued her Florinef and began midodrine because of worsening symptoms; she improved. Since last seen  she denies chest pain.  She does have some dyspnea on exertion and lower extremity edema.  She has fallen predominantly related to balance issues.  She had a syncopal episode back in October after not drinking or eating and not taking her midodrine.  Current Outpatient Medications  Medication Sig Dispense Refill  . ALPRAZolam (XANAX) 0.25 MG tablet TAKE 1 TO 2 TABLETS 3 TIMES A DAY AS NEEDED ANXIETY    . CALCIUM PO Take 1,000 mg by mouth daily.     Marland Kitchen. CALCIUM-VITAMIN D PO Take 1 capsule by mouth daily.    . Cyanocobalamin (VITAMIN B 12 PO) Take 1 tablet by mouth daily.    . midodrine (PROAMATINE) 5 MG tablet Take 1 tablet (5 mg total) by mouth 3 (three) times daily with meals. 90 tablet 7  . oxybutynin (DITROPAN-XL) 10 MG 24 hr tablet Take 10 mg by mouth daily.    Marland Kitchen. venlafaxine XR (EFFEXOR-XR) 150 MG 24 hr capsule Take 150 mg by mouth daily with breakfast.    . escitalopram (LEXAPRO) 20 MG tablet Take 20 mg by mouth daily.     No current facility-administered medications for this visit.      Past Medical History:  Diagnosis Date  . Ataxia   . Hyperlipidemia   . Neurologic orthostatic hypotension (HCC)     Past Surgical History:  Procedure Laterality Date  . ABDOMINAL HYSTERECTOMY    . SHOULDER SURGERY Right     Social History   Socioeconomic History  . Marital status: Married    Spouse name: Not on file  . Number of children: 2  . Years of education: Not on file  . Highest education level: Not on file  Occupational History  . Not on file  Social Needs  . Financial resource strain: Not on file  . Food insecurity:   Worry: Not on file    Inability: Not on file  . Transportation needs:    Medical: Not on file    Non-medical: Not on file  Tobacco Use  . Smoking status: Never Smoker  . Smokeless tobacco: Never Used  Substance and Sexual Activity  . Alcohol use: No  . Drug use: No  . Sexual activity: Not on file  Lifestyle  . Physical activity:    Days per week: Not on file    Minutes per session: Not on file  . Stress: Not on file  Relationships  . Social connections:    Talks on phone: Not on file    Gets together: Not on file    Attends religious service: Not on file    Active member of club or organization: Not on file    Attends meetings of clubs or organizations: Not on file    Relationship status: Not on file  . Intimate partner violence:    Fear of current or ex partner: Not on file    Emotionally abused: Not on file    Physically abused: Not on file    Forced sexual activity: Not on file  Other Topics Concern  . Not on file  Social History Narrative  . Not on file    Family History  Problem Relation Age of Onset  . Alzheimer's disease Mother   . Heart attack Father        MI at age 26  . Cancer Sister     ROS: no fevers or chills, productive cough, hemoptysis, dysphasia, odynophagia, melena, hematochezia, dysuria, hematuria, rash, seizure activity, orthopnea, PND, pedal edema, claudication. Remaining systems are negative.  Physical Exam: Well-developed well-nourished in no acute distress.  Skin is warm and dry.  HEENT is normal.  Neck is supple.  Chest is clear to auscultation with normal expansion.  Cardiovascular exam is regular rate and rhythm.  Abdominal exam nontender or distended. No masses palpated. Extremities show no edema. neuro resting tremor  A/P  1 orthostatic hypotension-patient appears to be doing reasonably well on present dose of midodrine.  Note 10 mg 3 times daily caused worsening edema and other side effects.  We will continue 5 mg TID.   Continue increased fluid intake, sodium intake and compression hose.  She had one syncopal episode last fall after not taking her midodrine and not eating or drinking.  2 hyperlipidemia-followed by primary care.  3 neurological disorder-Per primary care and neurology.  4 dyspnea-likely from neurological issues and diminished conditioning.  I will check echocardiogram to rule out worsening LV function.  Olga Millers, MD

## 2018-07-07 ENCOUNTER — Encounter: Payer: Self-pay | Admitting: Cardiology

## 2018-07-07 ENCOUNTER — Ambulatory Visit: Payer: Medicare Other | Admitting: Cardiology

## 2018-07-07 VITALS — BP 125/81 | HR 88 | Ht 65.0 in | Wt 228.8 lb

## 2018-07-07 DIAGNOSIS — E7800 Pure hypercholesterolemia, unspecified: Secondary | ICD-10-CM

## 2018-07-07 DIAGNOSIS — I951 Orthostatic hypotension: Secondary | ICD-10-CM | POA: Diagnosis not present

## 2018-07-07 DIAGNOSIS — R0602 Shortness of breath: Secondary | ICD-10-CM | POA: Diagnosis not present

## 2018-07-07 NOTE — Patient Instructions (Signed)
Medication Instructions:   NO CHANGE  Testing/Procedures:  Your physician has requested that you have an echocardiogram. Echocardiography is a painless test that uses sound waves to create images of your heart. It provides your doctor with information about the size and shape of your heart and how well your heart's chambers and valves are working. This procedure takes approximately one hour. There are no restrictions for this procedure.  IN [facility]  Follow-Up:  Your physician wants you to follow-up in: 6 MONTHS WITH DR Jens Som You will receive a reminder letter in the mail two months in advance. If you don't receive a letter, please call our office to schedule the follow-up appointment.   CALL IN June TO SCHEDULE APPOINTMENT IN [place]

## 2018-07-19 ENCOUNTER — Ambulatory Visit (HOSPITAL_BASED_OUTPATIENT_CLINIC_OR_DEPARTMENT_OTHER)
Admission: RE | Admit: 2018-07-19 | Discharge: 2018-07-19 | Disposition: A | Discharge status: Home / Self Care | Payer: Medicare Other | Source: Ambulatory Visit | Attending: Cardiology | Admitting: Cardiology

## 2018-07-19 DIAGNOSIS — R0602 Shortness of breath: Secondary | ICD-10-CM

## 2018-07-19 NOTE — Progress Notes (Signed)
  Echocardiogram 2D Echocardiogram has been performed.  Sarah Winters 07/19/2018, 10:49 AM

## 2018-12-13 ENCOUNTER — Other Ambulatory Visit: Payer: Self-pay | Admitting: Adult Health

## 2019-01-18 NOTE — Progress Notes (Signed)
HPI: FU orthostatic hypotension. Patient has had orthostatic hypotension for years. Previously followed by a cardiologist in [place]. When I saw her in November 2014 we discontinued her Florinef and began midodrine because of worsening symptoms; she improved.  Echo March 2020 showed normal LV function.  Since last seen  there is some dyspnea on exertion but no orthopnea, PND, pedal edema, chest pain or syncope.  She continues to have problems with balance and unsteadiness with occasional falls.  Current Outpatient Medications  Medication Sig Dispense Refill  . alendronate (FOSAMAX) 70 MG tablet TAKE 1 TABLET BY MOUTH ONE TIME PER WEEK    . ALPRAZolam (XANAX) 0.25 MG tablet TAKE 1 TO 2 TABLETS 3 TIMES A DAY AS NEEDED ANXIETY    . CALCIUM PO Take 1,000 mg by mouth daily.     . Cholecalciferol (VITAMIN D3 PO) Take by mouth as directed.    . Cyanocobalamin (VITAMIN B 12 PO) Take 1 tablet by mouth daily.    Marland Kitchen. ezetimibe (ZETIA) 10 MG tablet Take 10 mg by mouth daily.    . midodrine (PROAMATINE) 5 MG tablet TAKE 1 TABLET (5 MG TOTAL) BY MOUTH 3 (THREE) TIMES DAILY WITH MEALS. 270 tablet 0  . MYRBETRIQ 50 MG TB24 tablet Take 50 mg by mouth daily.    . rosuvastatin (CRESTOR) 10 MG tablet Take 10 mg by mouth daily.    Marland Kitchen. venlafaxine XR (EFFEXOR-XR) 150 MG 24 hr capsule Take 150 mg by mouth daily with breakfast.     No current facility-administered medications for this visit.      Past Medical History:  Diagnosis Date  . Ataxia   . Hyperlipidemia   . Neurologic orthostatic hypotension (HCC)     Past Surgical History:  Procedure Laterality Date  . ABDOMINAL HYSTERECTOMY    . SHOULDER SURGERY Right     Social History   Socioeconomic History  . Marital status: Married    Spouse name: Not on file  . Number of children: 2  . Years of education: Not on file  . Highest education level: Not on file  Occupational History  . Not on file  Social Needs  . Financial resource strain:  Not on file  . Food insecurity    Worry: Not on file    Inability: Not on file  . Transportation needs    Medical: Not on file    Non-medical: Not on file  Tobacco Use  . Smoking status: Never Smoker  . Smokeless tobacco: Never Used  Substance and Sexual Activity  . Alcohol use: No  . Drug use: No  . Sexual activity: Not on file  Lifestyle  . Physical activity    Days per week: Not on file    Minutes per session: Not on file  . Stress: Not on file  Relationships  . Social Musicianconnections    Talks on phone: Not on file    Gets together: Not on file    Attends religious service: Not on file    Active member of club or organization: Not on file    Attends meetings of clubs or organizations: Not on file    Relationship status: Not on file  . Intimate partner violence    Fear of current or ex partner: Not on file    Emotionally abused: Not on file    Physically abused: Not on file    Forced sexual activity: Not on file  Other Topics Concern  . Not on  file  Social History Narrative  . Not on file    Family History  Problem Relation Age of Onset  . Alzheimer's disease Mother   . Heart attack Father        MI at age 73  . Cancer Sister     ROS: no fevers or chills, productive cough, hemoptysis, dysphasia, odynophagia, melena, hematochezia, dysuria, hematuria, rash, seizure activity, orthopnea, PND, pedal edema, claudication. Remaining systems are negative.  Physical Exam: Well-developed well-nourished in no acute distress.  Skin is warm and dry.  HEENT is normal.  Neck is supple.  Chest is clear to auscultation with normal expansion.  Cardiovascular exam is regular rate and rhythm.  Abdominal exam nontender or distended. No masses palpated. Extremities show no edema. neuro grossly intact  ECG-sinus rhythm at a rate of 80, right axis deviation.  Personally reviewed  A/P  1 orthostatic hypotension-plan to continue midodrine.  Continue increase fluid intake as well as  sodium intake. Continue compression hose.  2 dyspnea-this is felt secondary to deconditioning.  Most recent echocardiogram shows preserved LV function.  3 hyperlipidemia-continue statin; followed by primary care.  4 neurological disorder-managed by neurology.  Kirk Ruths, MD

## 2019-01-19 ENCOUNTER — Ambulatory Visit (INDEPENDENT_AMBULATORY_CARE_PROVIDER_SITE_OTHER): Payer: Medicare Other | Admitting: Cardiology

## 2019-01-19 ENCOUNTER — Encounter: Payer: Self-pay | Admitting: Cardiology

## 2019-01-19 ENCOUNTER — Other Ambulatory Visit: Payer: Self-pay

## 2019-01-19 VITALS — BP 138/76 | HR 80 | Ht 65.0 in | Wt 227.4 lb

## 2019-01-19 DIAGNOSIS — R0602 Shortness of breath: Secondary | ICD-10-CM

## 2019-01-19 DIAGNOSIS — E7800 Pure hypercholesterolemia, unspecified: Secondary | ICD-10-CM | POA: Diagnosis not present

## 2019-01-19 DIAGNOSIS — I951 Orthostatic hypotension: Secondary | ICD-10-CM | POA: Diagnosis not present

## 2019-01-19 NOTE — Patient Instructions (Addendum)
Your physician recommends that you continue on your current medications as directed. Please refer to the Current Medication list given to you today.   Your physician wants you to follow-up in: 1 YEAR WITH DR. CRENSHAW. You will receive a reminder letter in the mail two months in advance. If you don't receive a letter, please call our office to schedule the follow-up appointment.    

## 2019-01-28 NOTE — Addendum Note (Signed)
Addended by: Patterson Hammersmith A on: 01/28/2019 04:37 PM   Modules accepted: Orders

## 2019-02-08 ENCOUNTER — Other Ambulatory Visit: Payer: Self-pay | Admitting: Cardiology

## 2019-05-17 ENCOUNTER — Other Ambulatory Visit: Payer: Self-pay | Admitting: Cardiology

## 2019-05-17 MED ORDER — MIDODRINE HCL 5 MG PO TABS: 5.0000 mg | ORAL_TABLET | Freq: Three times a day (TID) | ORAL | 2 refills | Status: DC

## 2019-05-17 NOTE — Telephone Encounter (Signed)
*  STAT* If patient is at the pharmacy, call can be transferred to refill team.   1. Which medications need to be refilled? (please list name of each medication and dose if known) Midodrine  2. Which pharmacy/location (including street and city if local pharmacy) is medication to be sent to?[facility]  3. Do they need a 30 day or 90 day supply? 270

## 2019-05-17 NOTE — Telephone Encounter (Signed)
Rx has been sent to the pharmacy electronically. ° °

## 2019-12-06 NOTE — Progress Notes (Signed)
HPI: FU orthostatic hypotension. Patient has had orthostatic hypotension for years. Previously followed by a cardiologist in [place]. When I saw her in November 2014 we discontinued her Florinef and began midodrine because of worsening symptoms; she improved.  Echo March 2020 showed normal LV function.  Since last seenshe denies increased dyspnea, chest pain or syncope.  She denies dizziness with standing.  She has fallen but this is related to her neurological disorder and imbalance.  Current Outpatient Medications  Medication Sig Dispense Refill  . alendronate (FOSAMAX) 70 MG tablet TAKE 1 TABLET BY MOUTH ONE TIME PER WEEK    . CALCIUM PO Take 1,000 mg by mouth daily.     . cephALEXin (KEFLEX) 500 MG capsule Take 500 mg by mouth daily.    . Cetirizine HCl 10 MG CAPS Take 10 mg by mouth daily.    . Cholecalciferol (VITAMIN D3 PO) Take by mouth as directed.    . ezetimibe (ZETIA) 10 MG tablet Take 10 mg by mouth daily.    . midodrine (PROAMATINE) 5 MG tablet Take 1 tablet (5 mg total) by mouth 3 (three) times daily with meals. 270 tablet 2  . MYRBETRIQ 50 MG TB24 tablet Take 50 mg by mouth daily.    . Probiotic Product (PROBIOTIC-10 ULTIMATE PO)     . rosuvastatin (CRESTOR) 10 MG tablet Take 10 mg by mouth daily.    Marland Kitchen venlafaxine XR (EFFEXOR-XR) 150 MG 24 hr capsule Take 150 mg by mouth daily with breakfast.     No current facility-administered medications for this visit.     Past Medical History:  Diagnosis Date  . Ataxia   . Hyperlipidemia   . Neurologic orthostatic hypotension (HCC)     Past Surgical History:  Procedure Laterality Date  . ABDOMINAL HYSTERECTOMY    . SHOULDER SURGERY Right     Social History   Socioeconomic History  . Marital status: Married    Spouse name: Not on file  . Number of children: 2  . Years of education: Not on file  . Highest education level: Not on file  Occupational History  . Not on file  Tobacco Use  . Smoking status:  Never Smoker  . Smokeless tobacco: Never Used  Substance and Sexual Activity  . Alcohol use: No  . Drug use: No  . Sexual activity: Not on file  Other Topics Concern  . Not on file  Social History Narrative  . Not on file   Social Determinants of Health   Financial Resource Strain:   . Difficulty of Paying Living Expenses:   Food Insecurity:   . Worried About Programme researcher, broadcasting/film/video in the Last Year:   . Barista in the Last Year:   Transportation Needs:   . Freight forwarder (Medical):   Marland Kitchen Lack of Transportation (Non-Medical):   Physical Activity:   . Days of Exercise per Week:   . Minutes of Exercise per Session:   Stress:   . Feeling of Stress :   Social Connections:   . Frequency of Communication with Friends and Family:   . Frequency of Social Gatherings with Friends and Family:   . Attends Religious Services:   . Active Member of Clubs or Organizations:   . Attends Banker Meetings:   Marland Kitchen Marital Status:   Intimate Partner Violence:   . Fear of Current or Ex-Partner:   . Emotionally Abused:   Marland Kitchen Physically Abused:   .  Sexually Abused:     Family History  Problem Relation Age of Onset  . Alzheimer's disease Mother   . Heart attack Father        MI at age 99  . Cancer Sister     ROS: no fevers or chills, productive cough, hemoptysis, dysphasia, odynophagia, melena, hematochezia, dysuria, hematuria, rash, seizure activity, orthopnea, PND, pedal edema, claudication. Remaining systems are negative.  Physical Exam: Well-developed well-nourished in no acute distress.  Skin is warm and dry.  HEENT is normal.  Neck is supple.  Chest is clear to auscultation with normal expansion.  Cardiovascular exam is regular rate and rhythm.  Abdominal exam nontender or distended. No masses palpated. Extremities show no edema. neuro resting tremor.  ECG- Sinus, right axis deviation.  Personally reviewed  A/P  1 orthostatic hypotension-symptoms are  well controlled.  We will continue midodrine at present dose.  We have again discussed the importance of maintaining hydration and increasing sodium intake.  Continue compression hose as well.  2 hyperlipidemia-continue statin.  3 dyspnea-I have felt previously that this is related to deconditioning.  She has not had chest pain.  Previous echocardiogram showed normal LV function.  4 neurological disorder-Per neurology.  Olga Millers, MD

## 2019-12-14 ENCOUNTER — Other Ambulatory Visit: Payer: Self-pay

## 2019-12-14 ENCOUNTER — Encounter: Payer: Self-pay | Admitting: Cardiology

## 2019-12-14 ENCOUNTER — Ambulatory Visit: Payer: Medicare Other | Admitting: Cardiology

## 2019-12-14 VITALS — BP 102/64 | HR 80 | Ht 65.0 in | Wt 215.0 lb

## 2019-12-14 DIAGNOSIS — R0602 Shortness of breath: Secondary | ICD-10-CM | POA: Diagnosis not present

## 2019-12-14 DIAGNOSIS — E7800 Pure hypercholesterolemia, unspecified: Secondary | ICD-10-CM | POA: Diagnosis not present

## 2019-12-14 DIAGNOSIS — I951 Orthostatic hypotension: Secondary | ICD-10-CM

## 2019-12-14 NOTE — Patient Instructions (Signed)
Medication Instructions:  NO CHANGE *If you need a refill on your cardiac medications before your next appointment, please call your pharmacy*   Lab Work: If you have labs (blood work) drawn today and your tests are completely normal, you will receive your results only by: . MyChart Message (if you have MyChart) OR . A paper copy in the mail If you have any lab test that is abnormal or we need to change your treatment, we will call you to review the results.   Follow-Up: At [facility], you and your health needs are our priority.  As part of our continuing mission to provide you with exceptional heart care, we have created designated Provider Care Teams.  These Care Teams include your primary Cardiologist (physician) and Advanced Practice Providers (APPs -  Physician Assistants and Nurse Practitioners) who all work together to provide you with the care you need, when you need it.  We recommend signing up for the patient portal called "MyChart".  Sign up information is provided on this After Visit Summary.  MyChart is used to connect with patients for Virtual Visits (Telemedicine).  Patients are able to view lab/test results, encounter notes, upcoming appointments, etc.  Non-urgent messages can be sent to your provider as well.   To learn more about what you can do with MyChart, go to [email or web address].    Your next appointment:   12 month(s)  The format for your next appointment:   In Person  Provider:   Brian Crenshaw, MD    

## 2020-03-27 ENCOUNTER — Other Ambulatory Visit: Payer: Self-pay

## 2020-03-27 MED ORDER — MIDODRINE HCL 5 MG PO TABS: 5.0000 mg | ORAL_TABLET | Freq: Three times a day (TID) | ORAL | 2 refills | Status: DC

## 2020-11-03 ENCOUNTER — Other Ambulatory Visit: Payer: Self-pay | Admitting: Cardiology

## 2020-11-21 ENCOUNTER — Ambulatory Visit: Payer: Medicare Other | Admitting: Cardiology

## 2020-11-26 ENCOUNTER — Ambulatory Visit: Payer: Medicare Other | Admitting: Cardiology

## 2020-11-26 ENCOUNTER — Other Ambulatory Visit: Payer: Self-pay | Admitting: Cardiology

## 2020-12-06 ENCOUNTER — Other Ambulatory Visit: Payer: Self-pay

## 2020-12-06 MED ORDER — MIDODRINE HCL 5 MG PO TABS: ORAL_TABLET | ORAL | 0 refills | Status: DC

## 2021-01-30 NOTE — Progress Notes (Signed)
HPI: FU orthostatic hypotension. Patient has had orthostatic hypotension for years. Previously followed by a cardiologist in [place]. When I saw her in November 2014 we discontinued her Florinef and began midodrine because of worsening symptoms; she improved.  Echo March 2020 showed normal LV function.  Since last seen she has some dyspnea unchanged.  No orthopnea, PND, pedal edema, chest pain, syncope.  She denies dizziness with standing.  Current Outpatient Medications  Medication Sig Dispense Refill   alendronate (FOSAMAX) 70 MG tablet TAKE 1 TABLET BY MOUTH ONE TIME PER WEEK     CALCIUM PO Take 1,000 mg by mouth daily.      cephALEXin (KEFLEX) 500 MG capsule Take 500 mg by mouth daily.     Cetirizine HCl 10 MG CAPS Take 10 mg by mouth daily.     Cholecalciferol (VITAMIN D3 PO) Take by mouth as directed.     ezetimibe (ZETIA) 10 MG tablet Take 10 mg by mouth daily.     midodrine (PROAMATINE) 5 MG tablet TAKE 1 TABLET BY MOUTH 3 TIMES DAILY WITH MEALS  PLEASE KEEP SCHEDULED APPT FOR FUTURE REFILLS 270 tablet 0   MYRBETRIQ 50 MG TB24 tablet Take 50 mg by mouth daily.     Probiotic Product (PROBIOTIC-10 ULTIMATE PO)      rosuvastatin (CRESTOR) 10 MG tablet Take 10 mg by mouth daily.     venlafaxine XR (EFFEXOR-XR) 150 MG 24 hr capsule Take 150 mg by mouth daily with breakfast.     No current facility-administered medications for this visit.     Past Medical History:  Diagnosis Date   Ataxia    Hyperlipidemia    Neurologic orthostatic hypotension (HCC)     Past Surgical History:  Procedure Laterality Date   ABDOMINAL HYSTERECTOMY     SHOULDER SURGERY Right     Social History   Socioeconomic History   Marital status: Married    Spouse name: Not on file   Number of children: 2   Years of education: Not on file   Highest education level: Not on file  Occupational History   Not on file  Tobacco Use   Smoking status: Never   Smokeless tobacco: Never  Substance  and Sexual Activity   Alcohol use: No   Drug use: No   Sexual activity: Not on file  Other Topics Concern   Not on file  Social History Narrative   Not on file   Social Determinants of Health   Financial Resource Strain: Not on file  Food Insecurity: Not on file  Transportation Needs: Not on file  Physical Activity: Not on file  Stress: Not on file  Social Connections: Not on file  Intimate Partner Violence: Not on file    Family History  Problem Relation Age of Onset   Alzheimer's disease Mother    Heart attack Father        MI at age 20   Cancer Sister     ROS: no fevers or chills, productive cough, hemoptysis, dysphasia, odynophagia, melena, hematochezia, dysuria, hematuria, rash, seizure activity, orthopnea, PND, pedal edema, claudication. Remaining systems are negative.  Physical Exam: Well-developed well-nourished in no acute distress.  Skin is warm and dry.  HEENT is normal.  Neck is supple.  Chest is clear to auscultation with normal expansion.  Cardiovascular exam is regular rate and rhythm.  Abdominal exam nontender or distended. No masses palpated. Extremities show no edema. neuro grossly intact  ECG-normal sinus rhythm at  a rate of 83, nonspecific ST changes.  Personally reviewed  A/P  1 orthostatic hypotension-symptoms seem to be reasonably well controlled.  Continue midodrine at present dose.  We also discussed the importance of maintaining hydration and sodium intake.  Continue compression hose.     2 hyperlipidemia-continue statin.   3 dyspnea-likely secondary to deconditioning.  Previous echocardiogram showed preserved LV function.  Continue to follow.   4 neurological disorder-Per neurology.  Olga Millers, MD

## 2021-02-04 ENCOUNTER — Encounter: Payer: Self-pay | Admitting: Cardiology

## 2021-02-04 ENCOUNTER — Ambulatory Visit: Payer: Medicare Other | Admitting: Cardiology

## 2021-02-04 ENCOUNTER — Other Ambulatory Visit: Payer: Self-pay

## 2021-02-04 VITALS — BP 127/73 | HR 83 | Ht 64.0 in | Wt 220.1 lb

## 2021-02-04 DIAGNOSIS — I951 Orthostatic hypotension: Secondary | ICD-10-CM | POA: Diagnosis not present

## 2021-02-04 DIAGNOSIS — R0602 Shortness of breath: Secondary | ICD-10-CM

## 2021-02-04 DIAGNOSIS — E7800 Pure hypercholesterolemia, unspecified: Secondary | ICD-10-CM

## 2021-02-04 MED ORDER — MIDODRINE HCL 5 MG PO TABS: ORAL_TABLET | ORAL | 3 refills | Status: DC

## 2021-02-04 NOTE — Patient Instructions (Signed)

## 2022-02-13 ENCOUNTER — Other Ambulatory Visit: Payer: Self-pay | Admitting: Cardiology

## 2022-03-24 NOTE — Progress Notes (Signed)
HPI: FU orthostatic hypotension. Patient has had orthostatic hypotension for years. Previously followed by a cardiologist in [place]. When I saw her in November 2014 we discontinued her Florinef and began midodrine because of worsening symptoms; she improved.  Echo March 2020 showed normal LV function.  Since last seen she denies chest pain or syncope.  She is not having difficulties with orthostatic symptoms on midodrine.  She does have some dyspnea on exertion.  No pedal edema.  Current Outpatient Medications  Medication Sig Dispense Refill   alendronate (FOSAMAX) 70 MG tablet TAKE 1 TABLET BY MOUTH ONE TIME PER WEEK     CALCIUM PO Take 1,000 mg by mouth daily.      cephALEXin (KEFLEX) 500 MG capsule Take 500 mg by mouth daily.     Cetirizine HCl 10 MG CAPS Take 10 mg by mouth daily.     Cholecalciferol (VITAMIN D3 PO) Take by mouth as directed.     ezetimibe (ZETIA) 10 MG tablet Take 10 mg by mouth daily.     midodrine (PROAMATINE) 5 MG tablet TAKE 1 TABLET BY MOUTH 3  TIMES DAILY WITH MEALS 270 tablet 3   MYRBETRIQ 50 MG TB24 tablet Take 50 mg by mouth daily.     Probiotic Product (PROBIOTIC-10 ULTIMATE PO)      rosuvastatin (CRESTOR) 10 MG tablet Take 10 mg by mouth daily.     venlafaxine XR (EFFEXOR-XR) 150 MG 24 hr capsule Take 150 mg by mouth daily with breakfast.     zonisamide (ZONEGRAN) 25 MG capsule Take 25 mg by mouth 2 (two) times daily.     No current facility-administered medications for this visit.     Past Medical History:  Diagnosis Date   Ataxia    Hyperlipidemia    Neurologic orthostatic hypotension (HCC)     Past Surgical History:  Procedure Laterality Date   ABDOMINAL HYSTERECTOMY     SHOULDER SURGERY Right     Social History   Socioeconomic History   Marital status: Married    Spouse name: Not on file   Number of children: 2   Years of education: Not on file   Highest education level: Not on file  Occupational History   Not on file   Tobacco Use   Smoking status: Never   Smokeless tobacco: Never  Substance and Sexual Activity   Alcohol use: No   Drug use: No   Sexual activity: Not on file  Other Topics Concern   Not on file  Social History Narrative   Not on file   Social Determinants of Health   Financial Resource Strain: Not on file  Food Insecurity: Not on file  Transportation Needs: Not on file  Physical Activity: Not on file  Stress: Not on file  Social Connections: Not on file  Intimate Partner Violence: Not on file    Family History  Problem Relation Age of Onset   Alzheimer's disease Mother    Heart attack Father        MI at age 98   Cancer Sister     ROS: Tremors but no fevers or chills, productive cough, hemoptysis, dysphasia, odynophagia, melena, hematochezia, dysuria, hematuria, rash, seizure activity, orthopnea, PND, pedal edema, claudication. Remaining systems are negative.  Physical Exam: Well-developed well-nourished in no acute distress.  Skin is warm and dry.  HEENT is normal.  Neck is supple.  Chest is clear to auscultation with normal expansion.  Cardiovascular exam is regular rate and rhythm.  Abdominal exam nontender or distended. No masses palpated. Extremities show no edema. neuro with resting tremor.  ECG-normal sinus rhythm at a rate of 85, no ST changes.  Personally reviewed  A/P  1 orthostatic hypotension-symptoms are reasonly well controlled at present.  We will continue midodrine.  She will continue to stress fluid and sodium intake.  Continue compression hose.  2 hyperlipidemia-continue statin.  3 history of dyspnea-this is felt secondary to deconditioning.  Previous echocardiogram showed normal LV function.  4 history of neurological disorder-followed by neurology.  Olga Millers, MD

## 2022-04-07 ENCOUNTER — Ambulatory Visit: Payer: Medicare Other | Admitting: Cardiology

## 2022-04-07 ENCOUNTER — Encounter: Payer: Self-pay | Admitting: Cardiology

## 2022-04-07 VITALS — BP 128/78 | HR 85 | Ht 64.0 in | Wt 207.4 lb

## 2022-04-07 DIAGNOSIS — I951 Orthostatic hypotension: Secondary | ICD-10-CM

## 2022-04-07 DIAGNOSIS — R0602 Shortness of breath: Secondary | ICD-10-CM | POA: Diagnosis not present

## 2022-04-07 DIAGNOSIS — E7800 Pure hypercholesterolemia, unspecified: Secondary | ICD-10-CM

## 2022-04-07 MED ORDER — MIDODRINE HCL 5 MG PO TABS: 5.0000 mg | ORAL_TABLET | Freq: Three times a day (TID) | ORAL | 3 refills | Status: DC

## 2022-04-07 NOTE — Patient Instructions (Signed)
  Follow-Up: At  [facility], you and your health needs are our priority.  As part of our continuing mission to provide you with exceptional heart care, we have created designated Provider Care Teams.  These Care Teams include your primary Cardiologist (physician) and Advanced Practice Providers (APPs -  Physician Assistants and Nurse Practitioners) who all work together to provide you with the care you need, when you need it.  We recommend signing up for the patient portal called "MyChart".  Sign up information is provided on this After Visit Summary.  MyChart is used to connect with patients for Virtual Visits (Telemedicine).  Patients are able to view lab/test results, encounter notes, upcoming appointments, etc.  Non-urgent messages can be sent to your provider as well.   To learn more about what you can do with MyChart, go to [email or web address].    Your next appointment:   12 month(s)  The format for your next appointment:   In Person  Provider:   Brian Crenshaw, MD   

## 2023-01-16 ENCOUNTER — Other Ambulatory Visit: Payer: Self-pay | Admitting: Cardiology

## 2023-04-16 ENCOUNTER — Other Ambulatory Visit: Payer: Self-pay | Admitting: Cardiology

## 2023-06-02 ENCOUNTER — Other Ambulatory Visit: Payer: Self-pay | Admitting: Cardiology

## 2023-06-16 ENCOUNTER — Other Ambulatory Visit: Payer: Self-pay | Admitting: Cardiology

## 2023-08-31 NOTE — Progress Notes (Signed)
 HPI: FU orthostatic hypotension. Patient has had orthostatic hypotension for years. Previously followed by a cardiologist in [place]. When I saw her in November 2014 we discontinued her Florinef and began midodrine  because of worsening symptoms; she improved.  Echo March 2020 showed normal LV function.  Since last seen she has worsening dyspnea but is now aspirating.  She denies chest pain or dizziness with standing.  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  Current Outpatient Medications  Medication Sig Dispense Refill   alendronate (FOSAMAX) 70 MG tablet TAKE 1 TABLET BY MOUTH ONE TIME PER WEEK     Cetirizine HCl 10 MG CAPS Take 10 mg by mouth daily.     Cholecalciferol (VITAMIN D3 PO) Take by mouth as directed.     dalfampridine 10 MG TB12      ezetimibe (ZETIA) 10 MG tablet Take 10 mg by mouth daily.     midodrine  (PROAMATINE ) 5 MG tablet Take 1 tablet (5 mg total) by mouth 3 (three) times daily with meals. KEEP MAY OV. 300 tablet 0   MYRBETRIQ 50 MG TB24 tablet Take 50 mg by mouth daily.     nitrofurantoin (MACRODANTIN) 100 MG capsule Take 100 mg by mouth daily.     Probiotic Product (PROBIOTIC-10 ULTIMATE PO)      rosuvastatin (CRESTOR) 10 MG tablet Take 10 mg by mouth daily.     venlafaxine XR (EFFEXOR-XR) 150 MG 24 hr capsule Take 150 mg by mouth daily with breakfast.     CALCIUM PO Take 1,000 mg by mouth daily.      cephALEXin (KEFLEX) 500 MG capsule Take 500 mg by mouth daily.     zonisamide (ZONEGRAN) 25 MG capsule Take 25 mg by mouth 2 (two) times daily.     No current facility-administered medications for this visit.     Past Medical History:  Diagnosis Date   Ataxia    Hyperlipidemia    Neurologic orthostatic hypotension (HCC)     Past Surgical History:  Procedure Laterality Date   ABDOMINAL HYSTERECTOMY     SHOULDER SURGERY Right     Social History   Socioeconomic History   Marital status: Married    Spouse name: Not on file    Number of children: 2   Years of education: Not on file   Highest education level: Not on file  Occupational History   Not on file  Tobacco Use   Smoking status: Never   Smokeless tobacco: Never  Substance and Sexual Activity   Alcohol use: No   Drug use: No   Sexual activity: Not on file  Other Topics Concern   Not on file  Social History Narrative   Not on file   Social Drivers of Health   Financial Resource Strain: Low Risk  (03/23/2023)   Received from [facility]   Overall Financial Resource Strain (CARDIA)    Difficulty of Paying Living Expenses: Not hard at all  Food Insecurity: Low Risk  (08/19/2023)   Received from [facility]   Hunger Vital Sign    Worried About Running Out of Food in the Last Year: Never true    Ran Out of Food in the Last Year: Patient declined to answer  Transportation Needs: No Transportation Needs (08/19/2023)   Received from [facility]    In the past 12 months, has lack of reliable transportation kept you from medical appointments, meetings, work or from getting things needed for  daily living? : No  Physical Activity: Unknown (03/23/2023)   Received from [facility]   Exercise Vital Sign    Days of Exercise per Week: 0 days    Minutes of Exercise per Session: Not on file  Stress: Stress Concern Present (03/23/2023)   Received from [facility] - Occupational Stress Questionnaire    Feeling of Stress : Rather much  Social Connections: Somewhat Isolated (03/23/2023)   Received from [facility]   Social Network    How would you rate your social network (family, work, friends)?: Restricted participation with some degree of social isolation  Intimate Partner Violence: Not At Risk (03/23/2023)   Received from [facility]   HITS    Over the last 12 months how often did your partner physically hurt you?: Never    Over the last 12 months how often did your partner insult you  or talk down to you?: Never    Over the last 12 months how often did your partner threaten you with physical harm?: Never    Over the last 12 months how often did your partner scream or curse at you?: Never    Family History  Problem Relation Age of Onset   Alzheimer's disease Mother    Heart attack Father        MI at age 92   Cancer Sister     ROS: no fevers or chills, productive cough, hemoptysis, dysphasia, odynophagia, melena, hematochezia, dysuria, hematuria, rash, seizure activity, orthopnea, PND, pedal edema, claudication. Remaining systems are negative.  Physical Exam: Well-developed well-nourished in no acute distress.  Skin is warm and dry.  HEENT is normal.  Neck is supple.  Chest is clear to auscultation with normal expansion.  Cardiovascular exam is regular rate and rhythm.  Abdominal exam nontender or distended. No masses palpated. Extremities show no edema. neuro grossly intact  EKG Interpretation Date/Time:  Monday Sep 14 2023 09:23:52 EDT Ventricular Rate:  81 PR Interval:  190 QRS Duration:  58 QT Interval:  354 QTC Calculation: 411 R Axis:   106  Text Interpretation: Normal sinus rhythm Rightward axis Low voltage QRS Nonspecific ST abnormality Confirmed by Alexandria Angel ([id number]) on 09/14/2023 9:25:49 AM    A/P  1 orthostatic hypotension-continue midodrine  at present dose.  We also discussed continued efforts at increased fluid and sodium intake.  She also uses compression hose.  Symptoms are reasonably well-controlled at this point.  2 hyperlipidemia-continue zetia and statin.  3 dyspnea-previous echocardiogram showed preserved LV function.  This is felt predominantly secondary to deconditioning.  4 neurological disorder-Per neurology.  Alexandria Angel, MD

## 2023-09-14 ENCOUNTER — Encounter: Payer: Self-pay | Admitting: Cardiology

## 2023-09-14 ENCOUNTER — Ambulatory Visit: Payer: Medicare Other | Admitting: Cardiology

## 2023-09-14 VITALS — BP 102/62 | HR 81 | Ht 64.0 in | Wt 203.0 lb

## 2023-09-14 DIAGNOSIS — R0602 Shortness of breath: Secondary | ICD-10-CM

## 2023-09-14 DIAGNOSIS — E7800 Pure hypercholesterolemia, unspecified: Secondary | ICD-10-CM | POA: Diagnosis not present

## 2023-09-14 DIAGNOSIS — I951 Orthostatic hypotension: Secondary | ICD-10-CM

## 2023-09-14 NOTE — Patient Instructions (Signed)

## 2023-10-13 ENCOUNTER — Other Ambulatory Visit: Payer: Self-pay | Admitting: Cardiology
